# Patient Record
Sex: Male | Born: 1995 | Race: White | Hispanic: No | Marital: Single | Smoking: Current every day smoker
Health system: Southern US, Community
[De-identification: ages and names within clinical notes are randomized; demographics above are authoritative.]

## PROBLEM LIST (undated history)

## (undated) DIAGNOSIS — F909 Attention-deficit hyperactivity disorder, unspecified type: Secondary | ICD-10-CM

## (undated) DIAGNOSIS — IMO0001 Reserved for inherently not codable concepts without codable children: Secondary | ICD-10-CM

## (undated) DIAGNOSIS — S129XXA Fracture of neck, unspecified, initial encounter: Secondary | ICD-10-CM

## (undated) DIAGNOSIS — F319 Bipolar disorder, unspecified: Secondary | ICD-10-CM

## (undated) DIAGNOSIS — S329XXA Fracture of unspecified parts of lumbosacral spine and pelvis, initial encounter for closed fracture: Secondary | ICD-10-CM

## (undated) DIAGNOSIS — K649 Unspecified hemorrhoids: Secondary | ICD-10-CM

## (undated) DIAGNOSIS — S12290D Other displaced fracture of third cervical vertebra, subsequent encounter for fracture with routine healing: Secondary | ICD-10-CM

## (undated) HISTORY — PX: GALLBLADDER SURGERY: SHX652

---
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
## 2014-04-25 ENCOUNTER — Encounter (HOSPITAL_BASED_OUTPATIENT_CLINIC_OR_DEPARTMENT_OTHER): Payer: Self-pay | Admitting: Emergency Medicine

## 2014-04-25 DIAGNOSIS — K8020 Calculus of gallbladder without cholecystitis without obstruction: Secondary | ICD-10-CM | POA: Insufficient documentation

## 2014-04-25 DIAGNOSIS — Z8659 Personal history of other mental and behavioral disorders: Secondary | ICD-10-CM | POA: Insufficient documentation

## 2014-04-25 DIAGNOSIS — Z720 Tobacco use: Secondary | ICD-10-CM | POA: Diagnosis not present

## 2014-04-25 DIAGNOSIS — R1011 Right upper quadrant pain: Secondary | ICD-10-CM | POA: Diagnosis present

## 2014-04-25 NOTE — ED Notes (Signed)
18 yo with sudden onset of RUQ abdominal pain. Pt reports n/v but no diarrhea at this time. Pain with palpation. 10/10 pain. Ambulatory at triage.

## 2014-04-26 ENCOUNTER — Emergency Department (HOSPITAL_BASED_OUTPATIENT_CLINIC_OR_DEPARTMENT_OTHER): Payer: Medicaid Other

## 2014-04-26 ENCOUNTER — Other Ambulatory Visit (HOSPITAL_BASED_OUTPATIENT_CLINIC_OR_DEPARTMENT_OTHER): Payer: Self-pay | Admitting: Emergency Medicine

## 2014-04-26 ENCOUNTER — Ambulatory Visit (HOSPITAL_BASED_OUTPATIENT_CLINIC_OR_DEPARTMENT_OTHER)
Admission: RE | Admit: 2014-04-26 | Discharge: 2014-04-26 | Disposition: A | Discharge status: Home / Self Care | Payer: Medicaid Other | Source: Ambulatory Visit | Attending: Emergency Medicine | Admitting: Emergency Medicine

## 2014-04-26 ENCOUNTER — Emergency Department (HOSPITAL_BASED_OUTPATIENT_CLINIC_OR_DEPARTMENT_OTHER)
Admission: EM | Admit: 2014-04-26 | Discharge: 2014-04-26 | Disposition: A | Discharge status: Home / Self Care | Payer: Medicaid Other | Attending: Emergency Medicine | Admitting: Emergency Medicine

## 2014-04-26 DIAGNOSIS — K8020 Calculus of gallbladder without cholecystitis without obstruction: Secondary | ICD-10-CM | POA: Insufficient documentation

## 2014-04-26 DIAGNOSIS — K8050 Calculus of bile duct without cholangitis or cholecystitis without obstruction: Secondary | ICD-10-CM

## 2014-04-26 DIAGNOSIS — R109 Unspecified abdominal pain: Secondary | ICD-10-CM

## 2014-04-26 LAB — CBC WITH DIFFERENTIAL/PLATELET
BASOS ABS: 0 10*3/uL (ref 0.0–0.1)
BASOS PCT: 0 % (ref 0–1)
Eosinophils Absolute: 0.3 10*3/uL (ref 0.0–0.7)
Eosinophils Relative: 3 % (ref 0–5)
HCT: 43.1 % (ref 39.0–52.0)
Hemoglobin: 14.5 g/dL (ref 13.0–17.0)
Lymphocytes Relative: 37 % (ref 12–46)
Lymphs Abs: 3.8 10*3/uL (ref 0.7–4.0)
MCH: 28.5 pg (ref 26.0–34.0)
MCHC: 33.6 g/dL (ref 30.0–36.0)
MCV: 84.7 fL (ref 78.0–100.0)
Monocytes Absolute: 0.9 10*3/uL (ref 0.1–1.0)
Monocytes Relative: 9 % (ref 3–12)
NEUTROS ABS: 5.4 10*3/uL (ref 1.7–7.7)
NEUTROS PCT: 51 % (ref 43–77)
PLATELETS: 224 10*3/uL (ref 150–400)
RBC: 5.09 MIL/uL (ref 4.22–5.81)
RDW: 12.6 % (ref 11.5–15.5)
WBC: 10.4 10*3/uL (ref 4.0–10.5)

## 2014-04-26 LAB — COMPREHENSIVE METABOLIC PANEL
ALBUMIN: 4.6 g/dL (ref 3.5–5.2)
ALK PHOS: 133 U/L — AB (ref 39–117)
ALT: 54 U/L — AB (ref 0–53)
AST: 42 U/L — AB (ref 0–37)
Anion gap: 17 — ABNORMAL HIGH (ref 5–15)
BILIRUBIN TOTAL: 0.5 mg/dL (ref 0.3–1.2)
BUN: 13 mg/dL (ref 6–23)
CHLORIDE: 102 meq/L (ref 96–112)
CO2: 24 mEq/L (ref 19–32)
CREATININE: 0.8 mg/dL (ref 0.50–1.35)
Calcium: 9.8 mg/dL (ref 8.4–10.5)
GFR calc Af Amer: >90 mL/min (ref >90)
GFR calc non Af Amer: >90 mL/min (ref >90)
Glucose, Bld: 97 mg/dL (ref 70–99)
POTASSIUM: 3.6 meq/L — AB (ref 3.7–5.3)
SODIUM: 143 meq/L (ref 137–147)
Total Protein: 8.6 g/dL — ABNORMAL HIGH (ref 6.0–8.3)

## 2014-04-26 LAB — URINALYSIS, ROUTINE W REFLEX MICROSCOPIC
Bilirubin Urine: NEGATIVE
Glucose, UA: NEGATIVE mg/dL
Hgb urine dipstick: NEGATIVE
KETONES UR: NEGATIVE mg/dL
LEUKOCYTES UA: NEGATIVE
NITRITE: NEGATIVE
PROTEIN: NEGATIVE mg/dL
Specific Gravity, Urine: 1.003 — ABNORMAL LOW (ref 1.005–1.030)
Urobilinogen, UA: 0.2 mg/dL (ref 0.0–1.0)
pH: 6.5 (ref 5.0–8.0)

## 2014-04-26 MED ORDER — FENTANYL CITRATE 0.05 MG/ML IJ SOLN
100.0000 ug | Freq: Once | INTRAMUSCULAR | Status: AC
  Administered 2014-04-26: 100 ug via INTRAVENOUS
  Filled 2014-04-26: qty 2

## 2014-04-26 NOTE — Discharge Instructions (Signed)
Biliary Colic  °Biliary colic is a steady or irregular pain in the upper abdomen. It is usually under the right side of the rib cage. It happens when gallstones interfere with the normal flow of bile from the gallbladder. Bile is a liquid that helps to digest fats. Bile is made in the liver and stored in the gallbladder. When you eat a meal, bile passes from the gallbladder through the cystic duct and the common bile duct into the small intestine. There, it mixes with partially digested food. If a gallstone blocks either of these ducts, the normal flow of bile is blocked. The muscle cells in the bile duct contract forcefully to try to move the stone. This causes the pain of biliary colic.  °SYMPTOMS  °· A person with biliary colic usually complains of pain in the upper abdomen. This pain can be: °¨ In the center of the upper abdomen just below the breastbone. °¨ In the upper-right part of the abdomen, near the gallbladder and liver. °¨ Spread back toward the right shoulder blade. °· Nausea and vomiting. °· The pain usually occurs after eating. °· Biliary colic is usually triggered by the digestive system's demand for bile. The demand for bile is high after fatty meals. Symptoms can also occur when a person who has been fasting suddenly eats a very large meal. Most episodes of biliary colic pass after 1 to 5 hours. After the most intense pain passes, your abdomen may continue to ache mildly for about 24 hours. °DIAGNOSIS  °After you describe your symptoms, your caregiver will perform a physical exam. He or she will pay attention to the upper right portion of your belly (abdomen). This is the area of your liver and gallbladder. An ultrasound will help your caregiver look for gallstones. Specialized scans of the gallbladder may also be done. Blood tests may be done, especially if you have fever or if your pain persists. °PREVENTION  °Biliary colic can be prevented by controlling the risk factors for gallstones. Some of  these risk factors, such as heredity, increasing age, and pregnancy are a normal part of life. Obesity and a high-fat diet are risk factors you can change through a healthy lifestyle. Women going through menopause who take hormone replacement therapy (estrogen) are also more likely to develop biliary colic. °TREATMENT  °· Pain medication may be prescribed. °· You may be encouraged to eat a fat-free diet. °· If the first episode of biliary colic is severe, or episodes of colic keep retuning, surgery to remove the gallbladder (cholecystectomy) is usually recommended. This procedure can be done through small incisions using an instrument called a laparoscope. The procedure often requires a brief stay in the hospital. Some people can leave the hospital the same day. It is the most widely used treatment in people troubled by painful gallstones. It is effective and safe, with no complications in more than 90% of cases. °· If surgery cannot be done, medication that dissolves gallstones may be used. This medication is expensive and can take months or years to work. Only small stones will dissolve. °· Rarely, medication to dissolve gallstones is combined with a procedure called shock-wave lithotripsy. This procedure uses carefully aimed shock waves to break up gallstones. In many people treated with this procedure, gallstones form again within a few years. °PROGNOSIS  °If gallstones block your cystic duct or common bile duct, you are at risk for repeated episodes of biliary colic. There is also a 25% chance that you will develop   a gallbladder infection(acute cholecystitis), or some other complication of gallstones within 10 to 20 years. If you have surgery, schedule it at a time that is convenient for you and at a time when you are not sick. °HOME CARE INSTRUCTIONS  °· Drink plenty of clear fluids. °· Avoid fatty, greasy or fried foods, or any foods that make your pain worse. °· Take medications as directed. °SEEK MEDICAL  CARE IF:  °· You develop a fever over 100.5° F (38.1° C). °· Your pain gets worse over time. °· You develop nausea that prevents you from eating and drinking. °· You develop vomiting. °SEEK IMMEDIATE MEDICAL CARE IF:  °· You have continuous or severe belly (abdominal) pain which is not relieved with medications. °· You develop nausea and vomiting which is not relieved with medications. °· You have symptoms of biliary colic and you suddenly develop a fever and shaking chills. This may signal cholecystitis. Call your caregiver immediately. °· You develop a yellow color to your skin or the white part of your eyes (jaundice). °Document Released: 10/04/2005 Document Revised: 07/26/2011 Document Reviewed: 12/14/2007 °ExitCare® Patient Information ©2015 [employer]. This information is not intended to replace advice given to you by your health care provider. Make sure you discuss any questions you have with your health care provider. ° °

## 2014-04-26 NOTE — ED Provider Notes (Signed)
CSN: [id number]     Arrival date & time 04/25/14  2307 History   First MD Initiated Contact with Patient 04/26/14 0400     Chief Complaint  Patient presents with  . Abdominal Pain     (Consider location/radiation/quality/duration/timing/severity/associated sxs/prior Treatment) HPI  This is a 18 year old male with right upper quadrant abdominal pain that began yesterday evening about 7 PM. He had "felt bad" earlier that day and had to leave school early. His pain became severe and he decided to come to the ED. The pain was associated with nausea and vomiting but no diarrhea. While waiting in the ED his pain is significantly improved although it is not completely absent. The pain is worse with movement or palpation.  Past Medical History  Diagnosis Date  . ADHD (attention deficit hyperactivity disorder)   . Bipolar 1 disorder    History reviewed. No pertinent past surgical history. No family history on file. History  Substance Use Topics  . Smoking status: Current Every Day Smoker -- 0.50 packs/day    Types: Cigarettes  . Smokeless tobacco: Not on file  . Alcohol Use: No    Review of Systems  All other systems reviewed and are negative.   Allergies  Review of patient's allergies indicates no known allergies.  Home Medications   Prior to Admission medications   Not on File   BP 129/74 mmHg  Pulse 74  Temp(Src) 98.2 F (36.8 C) (Oral)  Resp 18  Ht 6\' 1"  (1.854 m)  Wt 167 lb (75.751 kg)  BMI 22.04 kg/m2  SpO2 100%   Physical Exam  General: Well-developed, well-nourished male in no acute distress; appearance consistent with age of record HENT: normocephalic; atraumatic Eyes: pupils equal, round and reactive to light; extraocular muscles intact Neck: supple Heart: regular rate and rhythm; no murmurs, rubs or gallops Lungs: clear to auscultation bilaterally Abdomen: soft; nondistended; right upper quadrant tenderness; no masses or hepatosplenomegaly; bowel sounds  present; multiple large gallstones seen on bedside ultrasound Extremities: No deformity; full range of motion; pulses normal Neurologic: Awake, alert and oriented; motor function intact in all extremities and symmetric; no facial droop Skin: Warm and dry; facial acne Psychiatric: Normal mood and affect   ED Course  Procedures (including critical care time)   MDM   Nursing notes and vitals signs, including pulse oximetry, reviewed.  Summary of this visit's results, reviewed by myself:  Labs:  Results for orders placed or performed during the hospital encounter of 04/26/14 (from the past 24 hour(s))  Urinalysis, Routine w reflex microscopic     Status: Abnormal   Collection Time: 04/25/14 11:40 PM  Result Value Ref Range   Color, Urine YELLOW YELLOW   APPearance CLEAR CLEAR   Specific Gravity, Urine 1.003 (L) 1.005 - 1.030   pH 6.5 5.0 - 8.0   Glucose, UA NEGATIVE NEGATIVE mg/dL   Hgb urine dipstick NEGATIVE NEGATIVE   Bilirubin Urine NEGATIVE NEGATIVE   Ketones, ur NEGATIVE NEGATIVE mg/dL   Protein, ur NEGATIVE NEGATIVE mg/dL   Urobilinogen, UA 0.2 0.0 - 1.0 mg/dL   Nitrite NEGATIVE NEGATIVE   Leukocytes, UA NEGATIVE NEGATIVE  CBC with Differential     Status: None   Collection Time: 04/26/14  1:55 AM  Result Value Ref Range   WBC 10.4 4.0 - 10.5 K/uL   RBC 5.09 4.22 - 5.81 MIL/uL   Hemoglobin 14.5 13.0 - 17.0 g/dL   HCT 40.943.1 81.139.0 - 91.452.0 %   MCV 84.7 78.0 - 100.0  fL   MCH 28.5 26.0 - 34.0 pg   MCHC 33.6 30.0 - 36.0 g/dL   RDW 78.412.6 69.611.5 - 29.515.5 %   Platelets 224 150 - 400 K/uL   Neutrophils Relative % 51 43 - 77 %   Neutro Abs 5.4 1.7 - 7.7 K/uL   Lymphocytes Relative 37 12 - 46 %   Lymphs Abs 3.8 0.7 - 4.0 K/uL   Monocytes Relative 9 3 - 12 %   Monocytes Absolute 0.9 0.1 - 1.0 K/uL   Eosinophils Relative 3 0 - 5 %   Eosinophils Absolute 0.3 0.0 - 0.7 K/uL   Basophils Relative 0 0 - 1 %   Basophils Absolute 0.0 0.0 - 0.1 K/uL  Comprehensive metabolic panel      Status: Abnormal   Collection Time: 04/26/14  1:55 AM  Result Value Ref Range   Sodium 143 137 - 147 mEq/L   Potassium 3.6 (L) 3.7 - 5.3 mEq/L   Chloride 102 96 - 112 mEq/L   CO2 24 19 - 32 mEq/L   Glucose, Bld 97 70 - 99 mg/dL   BUN 13 6 - 23 mg/dL   Creatinine, Ser 2.840.80 0.50 - 1.35 mg/dL   Calcium 9.8 8.4 - 13.210.5 mg/dL   Total Protein 8.6 (H) 6.0 - 8.3 g/dL   Albumin 4.6 3.5 - 5.2 g/dL   AST 42 (H) 0 - 37 U/L   ALT 54 (H) 0 - 53 U/L   Alkaline Phosphatase 133 (H) 39 - 117 U/L   Total Bilirubin 0.5 0.3 - 1.2 mg/dL   GFR calc non Af Amer >90 >90 mL/min   GFR calc Af Amer >90 >90 mL/min   Anion gap 17 (H) 5 - 15    Imaging Studies: Dg Abd Acute W/chest  04/26/2014   CLINICAL DATA:  Sudden onset RIGHT upper quadrant pain last night with nausea and vomiting, constipation for 2 weeks, history smoking, initial encounter  EXAM: ACUTE ABDOMEN SERIES (ABDOMEN 2 VIEW & CHEST 1 VIEW)  COMPARISON:  None  FINDINGS: Normal heart size, mediastinal contours, and pulmonary vascularity.  Lungs hyperinflated but clear.  No pleural effusion or pneumothorax.  Normal bowel gas pattern.  No bowel dilatation or bowel wall thickening, or free intraperitoneal air.  Bones unremarkable for technique.  No urinary tract calcification.  IMPRESSION: No acute abnormalities.   Electronically Signed   By: Ulyses SouthwardMark  Boles M.D.   On: 04/26/2014 02:20   4:14 AM We will have patient return for formal ultrasound later this morning. I do not believe he has acute cholecystitis at this time.      Hanley SeamenJohn L Carlene Bickley, MD 04/26/14 [phone]

## 2014-04-26 NOTE — ED Notes (Signed)
Rt upper abd pain onset 1900 last pm,  States had nausea and vomiting last pm,  Denies diarrhea

## 2014-04-26 NOTE — ED Provider Notes (Signed)
Patient returns for ultrasound results. Cholelithiasis without gallbladder wall thickening or pericholecystic fluid. Positive sonographic Murphy sign during ultrasound. Patient appears well and nontoxic. Tenderness in the right upper quadrant. HIDA scan recommended by radiology.  Mild elevation of LFTs last night.  Recommend follow up with surgery clinic.  If pain worsens or patient develops vomiting or fever recommended [facility]. Advised patient he could check in to be reevaluated but would likely need [facility] or [facility]. Patient declines further evaluation at this facility at this time.   Glynn OctaveStephen Jaidin Richison, MD 04/26/14 [phone]

## 2014-04-28 ENCOUNTER — Emergency Department (HOSPITAL_COMMUNITY)
Admission: EM | Admit: 2014-04-28 | Discharge: 2014-04-29 | Disposition: A | Discharge status: Home / Self Care | Payer: Medicaid Other | Attending: Emergency Medicine | Admitting: Emergency Medicine

## 2014-04-28 ENCOUNTER — Encounter (HOSPITAL_COMMUNITY): Payer: Self-pay

## 2014-04-28 DIAGNOSIS — K8050 Calculus of bile duct without cholangitis or cholecystitis without obstruction: Secondary | ICD-10-CM

## 2014-04-28 DIAGNOSIS — Z8659 Personal history of other mental and behavioral disorders: Secondary | ICD-10-CM | POA: Diagnosis not present

## 2014-04-28 DIAGNOSIS — K8020 Calculus of gallbladder without cholecystitis without obstruction: Secondary | ICD-10-CM | POA: Diagnosis not present

## 2014-04-28 DIAGNOSIS — R1011 Right upper quadrant pain: Secondary | ICD-10-CM

## 2014-04-28 DIAGNOSIS — R0602 Shortness of breath: Secondary | ICD-10-CM | POA: Insufficient documentation

## 2014-04-28 DIAGNOSIS — Z720 Tobacco use: Secondary | ICD-10-CM | POA: Insufficient documentation

## 2014-04-28 NOTE — ED Notes (Signed)
Pt complains of right upper quad pain, n, v, he states that he ate greasy food today and has gallbladder problems.

## 2014-04-29 ENCOUNTER — Emergency Department (HOSPITAL_COMMUNITY): Payer: Medicaid Other

## 2014-04-29 LAB — CBC WITH DIFFERENTIAL/PLATELET
Basophils Absolute: 0 10*3/uL (ref 0.0–0.1)
Basophils Relative: 0 % (ref 0–1)
Eosinophils Absolute: 0.2 10*3/uL (ref 0.0–0.7)
Eosinophils Relative: 2 % (ref 0–5)
HEMATOCRIT: 41.8 % (ref 39.0–52.0)
HEMOGLOBIN: 14.2 g/dL (ref 13.0–17.0)
Lymphocytes Relative: 37 % (ref 12–46)
Lymphs Abs: 3.4 10*3/uL (ref 0.7–4.0)
MCH: 28.7 pg (ref 26.0–34.0)
MCHC: 34 g/dL (ref 30.0–36.0)
MCV: 84.6 fL (ref 78.0–100.0)
MONO ABS: 0.8 10*3/uL (ref 0.1–1.0)
MONOS PCT: 9 % (ref 3–12)
Neutro Abs: 4.8 10*3/uL (ref 1.7–7.7)
Neutrophils Relative %: 52 % (ref 43–77)
Platelets: 240 10*3/uL (ref 150–400)
RBC: 4.94 MIL/uL (ref 4.22–5.81)
RDW: 12.6 % (ref 11.5–15.5)
WBC: 9.2 10*3/uL (ref 4.0–10.5)

## 2014-04-29 LAB — URINALYSIS, ROUTINE W REFLEX MICROSCOPIC
BILIRUBIN URINE: NEGATIVE
Glucose, UA: NEGATIVE mg/dL
HGB URINE DIPSTICK: NEGATIVE
KETONES UR: NEGATIVE mg/dL
Leukocytes, UA: NEGATIVE
NITRITE: NEGATIVE
PH: 7.5 (ref 5.0–8.0)
Protein, ur: NEGATIVE mg/dL
Specific Gravity, Urine: 1.018 (ref 1.005–1.030)
Urobilinogen, UA: 0.2 mg/dL (ref 0.0–1.0)

## 2014-04-29 LAB — COMPREHENSIVE METABOLIC PANEL
ALK PHOS: 131 U/L — AB (ref 39–117)
ALT: 38 U/L (ref 0–53)
ANION GAP: 15 (ref 5–15)
AST: 33 U/L (ref 0–37)
Albumin: 4.5 g/dL (ref 3.5–5.2)
BILIRUBIN TOTAL: 0.4 mg/dL (ref 0.3–1.2)
BUN: 13 mg/dL (ref 6–23)
CHLORIDE: 102 meq/L (ref 96–112)
CO2: 23 meq/L (ref 19–32)
CREATININE: 0.83 mg/dL (ref 0.50–1.35)
Calcium: 9.9 mg/dL (ref 8.4–10.5)
GFR calc Af Amer: >90 mL/min (ref >90)
GFR calc non Af Amer: >90 mL/min (ref >90)
Glucose, Bld: 107 mg/dL — ABNORMAL HIGH (ref 70–99)
Potassium: 3.7 mEq/L (ref 3.7–5.3)
Sodium: 140 mEq/L (ref 137–147)
Total Protein: 8 g/dL (ref 6.0–8.3)

## 2014-04-29 LAB — LIPASE, BLOOD: LIPASE: 30 U/L (ref 11–59)

## 2014-04-29 MED ORDER — OXYCODONE-ACETAMINOPHEN 5-325 MG PO TABS: 1.0000 | ORAL_TABLET | Freq: Four times a day (QID) | ORAL | Status: DC | PRN

## 2014-04-29 MED ORDER — MORPHINE SULFATE 4 MG/ML IJ SOLN
4.0000 mg | Freq: Once | INTRAMUSCULAR | Status: AC
  Administered 2014-04-29: 4 mg via INTRAVENOUS
  Filled 2014-04-29: qty 1

## 2014-04-29 MED ORDER — SODIUM CHLORIDE 0.9 % IV BOLUS (SEPSIS)
1000.0000 mL | Freq: Once | INTRAVENOUS | Status: AC
Start: 2014-04-29 — End: 2014-04-29
  Administered 2014-04-29: 1000 mL via INTRAVENOUS

## 2014-04-29 MED ORDER — ONDANSETRON HCL 4 MG/2ML IJ SOLN
4.0000 mg | INTRAMUSCULAR | Status: AC
  Administered 2014-04-29: 4 mg via INTRAVENOUS
  Filled 2014-04-29: qty 2

## 2014-04-29 NOTE — ED Provider Notes (Signed)
CSN: [id number]     Arrival date & time 04/28/14  2304 History   First MD Initiated Contact with Patient 04/29/14 0106     Chief Complaint  Patient presents with  . Abdominal Pain    (Consider location/radiation/quality/duration/timing/severity/associated sxs/prior Treatment) HPI Comments: Patient is a 18 year old male with no significant past medical history who presents to the emergency department for further evaluation of right upper abdominal pain. He states the pain began at 1900 today and has been intermittent over the past 4 days. Symptoms this evening began after eating baked beans with bacon. He has had associated nausea as well as 3 episodes of nonbloody emesis. Patient was evaluated 4 days ago for similar symptoms at [facility]. He was told at this time that he had gallstones which were likely the cause of his symptoms. He has some associated shortness of breath described mostly as splinting secondary to pain. He has had no associated fever, chest pain, diarrhea, urinary symptoms, melena or hematochezia. No hx of abdominal surgeries.  Patient is a 18 y.o. male presenting with abdominal pain. The history is provided by the patient. No language interpreter was used.  Abdominal Pain Associated symptoms: nausea, shortness of breath and vomiting   Associated symptoms: no diarrhea     Past Medical History  Diagnosis Date  . ADHD (attention deficit hyperactivity disorder)   . Bipolar 1 disorder    History reviewed. No pertinent past surgical history. History reviewed. No pertinent family history. History  Substance Use Topics  . Smoking status: Current Every Day Smoker -- 0.50 packs/day    Types: Cigarettes  . Smokeless tobacco: Not on file  . Alcohol Use: No    Review of Systems  Respiratory: Positive for shortness of breath.   Gastrointestinal: Positive for nausea, vomiting and abdominal pain. Negative for diarrhea and blood in stool.  All other systems reviewed  and are negative.   Allergies  Review of patient's allergies indicates no known allergies.  Home Medications   Prior to Admission medications   Medication Sig Start Date End Date Taking? Authorizing Provider  ibuprofen (ADVIL,MOTRIN) 200 MG tablet Take 200 mg by mouth every 6 (six) hours as needed for moderate pain.   Yes Historical Provider, MD   BP 115/71 mmHg  Pulse 57  Temp(Src) 98.1 F (36.7 C) (Oral)  Resp 18  SpO2 97%   Physical Exam  Constitutional: He is oriented to person, place, and time. He appears well-developed and well-nourished. No distress.  Nontoxic/nonseptic appearing  HENT:  Head: Normocephalic and atraumatic.  Eyes: Conjunctivae and EOM are normal. No scleral icterus.  Neck: Normal range of motion.  Cardiovascular: Normal rate, regular rhythm and normal heart sounds.   Pulmonary/Chest: Effort normal and breath sounds normal. No respiratory distress. He has no wheezes. He has no rales.  Respirations even and unlabored  Abdominal: Soft. Normal appearance. He exhibits no distension. There is tenderness in the right upper quadrant. There is positive Murphy's sign. There is no rebound and no guarding.  Focal tenderness to palpation in the right upper quadrant with a mild positive Murphy sign. Patient has no peritoneal signs or involuntary guarding. No masses appreciated.  Musculoskeletal: Normal range of motion.  Neurological: He is alert and oriented to person, place, and time. He exhibits normal muscle tone. Coordination normal.  GCS 15. Patient moves extremities without ataxia.  Skin: Skin is warm and dry. No rash noted. He is not diaphoretic. No erythema. No pallor.  Psychiatric: He has  a normal mood and affect. His behavior is normal.  Nursing note and vitals reviewed.   ED Course  Procedures (including critical care time) Labs Review Labs Reviewed  COMPREHENSIVE METABOLIC PANEL - Abnormal; Notable for the following:    Glucose, Bld 107 (*)     Alkaline Phosphatase 131 (*)    All other components within normal limits  CBC WITH DIFFERENTIAL  LIPASE, BLOOD  URINALYSIS, ROUTINE W REFLEX MICROSCOPIC    Imaging Review [place] Abdomen Limited Ruq  04/29/2014   CLINICAL DATA:  Right upper quadrant pain  EXAM: US ABDOMEN LIMITED - RIGHT UPPER QUADRANT  COMPARISON:  03/26/2010  FINDINGS: Gallbladder:  The gallbladder is filled with shadowing gallstones. The gallbladder is focally tender but there is no wall thickening or pericholecystic fluid to suggest acute cholecystitis. It is difficult to estimate the size of the gallbladder due to shadowing posteriorly.  Common bile duct:  Diameter: 4 mm  Liver:  No focal lesion identified. Within normal limits in parenchymal echogenicity. Antegrade flow in the imaged portal venous system.  IMPRESSION: 1. Cholelithiasis. 2. There is focal gallbladder tenderness, but no inflammatory wall thickening or edema typical of acute cholecystitis.   Electronically Signed   By: Tiburcio PeaJonathan  Watts M.D.   On: 04/29/2014 03:16     EKG Interpretation None      MDM   Final diagnoses:  RUQ pain  Biliary colic    18 year old male presents to the emergency department for persistent right upper quadrant pain after eating B beans with bacon. Patient was diagnosed with cholelithiasis at [facility] on 04/26/2014. Patient today has no fever or elevated LFTs. No leukocytosis. He has focal tenderness in his right upper quadrant with a mild positive Murphy sign. Ultrasound obtained which shows cholelithiasis without evidence of acute cholecystitis. No inflammatory wall thickening or pericholecystic fluid.  Patient treated in ED with morphine and Zofran. He has had no further emesis since this time and states that his pain has resolved. Symptoms tonight consistent with biliary colic. No indication for admission for emergent cholecystectomy. Patient has been referred back to general surgery to discuss elective cholecystectomy.  Will prescribe short course of Percocet as needed for pain control. Have advised dietary changes to prevent further gallbladder attacks. Return precautions discussed and provided. Patient agreeable to plan with no unaddressed concerns.   Filed Vitals:   04/28/14 2306 04/28/14 2308 04/29/14 0119 04/29/14 0236  BP:  138/84 134/77 115/71  Pulse:  68 69 57  Temp:  98.1 F (36.7 C)    TempSrc:  Oral    Resp:  20 18 18   SpO2: 100% 100% 98% 97%      Antony MaduraKelly Earlie Schank, PA-C 04/29/14 0405  Olivia Mackielga M Otter, MD 04/29/14 [phone]

## 2014-04-29 NOTE — ED Notes (Signed)
US at bedside

## 2014-04-29 NOTE — Discharge Instructions (Signed)
Biliary Colic  °Biliary colic is a steady or irregular pain in the upper abdomen. It is usually under the right side of the rib cage. It happens when gallstones interfere with the normal flow of bile from the gallbladder. Bile is a liquid that helps to digest fats. Bile is made in the liver and stored in the gallbladder. When you eat a meal, bile passes from the gallbladder through the cystic duct and the common bile duct into the small intestine. There, it mixes with partially digested food. If a gallstone blocks either of these ducts, the normal flow of bile is blocked. The muscle cells in the bile duct contract forcefully to try to move the stone. This causes the pain of biliary colic.  °SYMPTOMS  °· A person with biliary colic usually complains of pain in the upper abdomen. This pain can be: °¨ In the center of the upper abdomen just below the breastbone. °¨ In the upper-right part of the abdomen, near the gallbladder and liver. °¨ Spread back toward the right shoulder blade. °· Nausea and vomiting. °· The pain usually occurs after eating. °· Biliary colic is usually triggered by the digestive system's demand for bile. The demand for bile is high after fatty meals. Symptoms can also occur when a person who has been fasting suddenly eats a very large meal. Most episodes of biliary colic pass after 1 to 5 hours. After the most intense pain passes, your abdomen may continue to ache mildly for about 24 hours. °DIAGNOSIS  °After you describe your symptoms, your caregiver will perform a physical exam. He or she will pay attention to the upper right portion of your belly (abdomen). This is the area of your liver and gallbladder. An ultrasound will help your caregiver look for gallstones. Specialized scans of the gallbladder may also be done. Blood tests may be done, especially if you have fever or if your pain persists. °PREVENTION  °Biliary colic can be prevented by controlling the risk factors for gallstones. Some of  these risk factors, such as heredity, increasing age, and pregnancy are a normal part of life. Obesity and a high-fat diet are risk factors you can change through a healthy lifestyle. Women going through menopause who take hormone replacement therapy (estrogen) are also more likely to develop biliary colic. °TREATMENT  °· Pain medication may be prescribed. °· You may be encouraged to eat a fat-free diet. °· If the first episode of biliary colic is severe, or episodes of colic keep retuning, surgery to remove the gallbladder (cholecystectomy) is usually recommended. This procedure can be done through small incisions using an instrument called a laparoscope. The procedure often requires a brief stay in the hospital. Some people can leave the hospital the same day. It is the most widely used treatment in people troubled by painful gallstones. It is effective and safe, with no complications in more than 90% of cases. °· If surgery cannot be done, medication that dissolves gallstones may be used. This medication is expensive and can take months or years to work. Only small stones will dissolve. °· Rarely, medication to dissolve gallstones is combined with a procedure called shock-wave lithotripsy. This procedure uses carefully aimed shock waves to break up gallstones. In many people treated with this procedure, gallstones form again within a few years. °PROGNOSIS  °If gallstones block your cystic duct or common bile duct, you are at risk for repeated episodes of biliary colic. There is also a 25% chance that you will develop   a gallbladder infection(acute cholecystitis), or some other complication of gallstones within 10 to 20 years. If you have surgery, schedule it at a time that is convenient for you and at a time when you are not sick. °HOME CARE INSTRUCTIONS  °· Drink plenty of clear fluids. °· Avoid fatty, greasy or fried foods, or any foods that make your pain worse. °· Take medications as directed. °SEEK MEDICAL  CARE IF:  °· You develop a fever over 100.5° F (38.1° C). °· Your pain gets worse over time. °· You develop nausea that prevents you from eating and drinking. °· You develop vomiting. °SEEK IMMEDIATE MEDICAL CARE IF:  °· You have continuous or severe belly (abdominal) pain which is not relieved with medications. °· You develop nausea and vomiting which is not relieved with medications. °· You have symptoms of biliary colic and you suddenly develop a fever and shaking chills. This may signal cholecystitis. Call your caregiver immediately. °· You develop a yellow color to your skin or the white part of your eyes (jaundice). °Document Released: 10/04/2005 Document Revised: 07/26/2011 Document Reviewed: 12/14/2007 °ExitCare® Patient Information ©2015 [employer]. This information is not intended to replace advice given to you by your health care provider. Make sure you discuss any questions you have with your health care provider. ° °

## 2014-07-08 ENCOUNTER — Emergency Department (HOSPITAL_COMMUNITY): Payer: Medicaid Other

## 2014-07-08 ENCOUNTER — Encounter (HOSPITAL_COMMUNITY): Payer: Self-pay | Admitting: *Deleted

## 2014-07-08 ENCOUNTER — Emergency Department (HOSPITAL_COMMUNITY)
Admission: EM | Admit: 2014-07-08 | Discharge: 2014-07-08 | Disposition: A | Discharge status: Home / Self Care | Payer: Medicaid Other | Attending: Emergency Medicine | Admitting: Emergency Medicine

## 2014-07-08 DIAGNOSIS — K8050 Calculus of bile duct without cholangitis or cholecystitis without obstruction: Secondary | ICD-10-CM

## 2014-07-08 DIAGNOSIS — Z8659 Personal history of other mental and behavioral disorders: Secondary | ICD-10-CM | POA: Diagnosis not present

## 2014-07-08 DIAGNOSIS — Z720 Tobacco use: Secondary | ICD-10-CM | POA: Diagnosis not present

## 2014-07-08 DIAGNOSIS — K8070 Calculus of gallbladder and bile duct without cholecystitis without obstruction: Secondary | ICD-10-CM | POA: Diagnosis not present

## 2014-07-08 DIAGNOSIS — Z79899 Other long term (current) drug therapy: Secondary | ICD-10-CM | POA: Diagnosis not present

## 2014-07-08 DIAGNOSIS — R1011 Right upper quadrant pain: Secondary | ICD-10-CM

## 2014-07-08 DIAGNOSIS — K8020 Calculus of gallbladder without cholecystitis without obstruction: Secondary | ICD-10-CM

## 2014-07-08 LAB — CBC WITH DIFFERENTIAL/PLATELET
BASOS ABS: 0 10*3/uL (ref 0.0–0.1)
Basophils Relative: 0 % (ref 0–1)
EOS PCT: 1 % (ref 0–5)
Eosinophils Absolute: 0.1 10*3/uL (ref 0.0–0.7)
HEMATOCRIT: 42.9 % (ref 39.0–52.0)
Hemoglobin: 15.1 g/dL (ref 13.0–17.0)
LYMPHS ABS: 2.9 10*3/uL (ref 0.7–4.0)
LYMPHS PCT: 36 % (ref 12–46)
MCH: 28.4 pg (ref 26.0–34.0)
MCHC: 35.2 g/dL (ref 30.0–36.0)
MCV: 80.8 fL (ref 78.0–100.0)
Monocytes Absolute: 0.7 10*3/uL (ref 0.1–1.0)
Monocytes Relative: 9 % (ref 3–12)
Neutro Abs: 4.3 10*3/uL (ref 1.7–7.7)
Neutrophils Relative %: 54 % (ref 43–77)
Platelets: 212 10*3/uL (ref 150–400)
RBC: 5.31 MIL/uL (ref 4.22–5.81)
RDW: 13.2 % (ref 11.5–15.5)
WBC: 8.1 10*3/uL (ref 4.0–10.5)

## 2014-07-08 LAB — COMPREHENSIVE METABOLIC PANEL
ALK PHOS: 110 U/L (ref 39–117)
ALT: 32 U/L (ref 0–53)
AST: 33 U/L (ref 0–37)
Albumin: 4.8 g/dL (ref 3.5–5.2)
Anion gap: 10 (ref 5–15)
BILIRUBIN TOTAL: 1.5 mg/dL — AB (ref 0.3–1.2)
BUN: 10 mg/dL (ref 6–23)
CALCIUM: 10 mg/dL (ref 8.4–10.5)
CHLORIDE: 108 mmol/L (ref 96–112)
CO2: 22 mmol/L (ref 19–32)
Creatinine, Ser: 0.79 mg/dL (ref 0.50–1.35)
GFR calc non Af Amer: >90 mL/min (ref >90)
Glucose, Bld: 107 mg/dL — ABNORMAL HIGH (ref 70–99)
POTASSIUM: 3.3 mmol/L — AB (ref 3.5–5.1)
Sodium: 140 mmol/L (ref 135–145)
Total Protein: 7.9 g/dL (ref 6.0–8.3)

## 2014-07-08 LAB — LIPASE, BLOOD: Lipase: 26 U/L (ref 11–59)

## 2014-07-08 MED ORDER — POTASSIUM CHLORIDE CRYS ER 20 MEQ PO TBCR
40.0000 meq | EXTENDED_RELEASE_TABLET | Freq: Once | ORAL | Status: AC
  Administered 2014-07-08: 40 meq via ORAL
  Filled 2014-07-08: qty 2

## 2014-07-08 MED ORDER — OXYCODONE-ACETAMINOPHEN 5-325 MG PO TABS: 1.0000 | ORAL_TABLET | Freq: Four times a day (QID) | ORAL | Status: DC | PRN

## 2014-07-08 MED ORDER — ONDANSETRON 4 MG PO TBDP
8.0000 mg | ORAL_TABLET | Freq: Once | ORAL | Status: AC
  Administered 2014-07-08: 8 mg via ORAL

## 2014-07-08 MED ORDER — ONDANSETRON 8 MG PO TBDP: ORAL_TABLET | ORAL | Status: DC

## 2014-07-08 MED ORDER — MORPHINE SULFATE 4 MG/ML IJ SOLN
4.0000 mg | Freq: Once | INTRAMUSCULAR | Status: AC
  Administered 2014-07-08: 4 mg via INTRAVENOUS
  Filled 2014-07-08: qty 1

## 2014-07-08 MED ORDER — ONDANSETRON HCL 4 MG/2ML IJ SOLN
4.0000 mg | Freq: Once | INTRAMUSCULAR | Status: AC
  Administered 2014-07-08: 4 mg via INTRAVENOUS
  Filled 2014-07-08: qty 2

## 2014-07-08 MED ORDER — ONDANSETRON 4 MG PO TBDP
ORAL_TABLET | ORAL | Status: AC
  Filled 2014-07-08: qty 2

## 2014-07-08 NOTE — ED Notes (Signed)
Patient transported to Ultrasound 

## 2014-07-08 NOTE — ED Notes (Signed)
Pt reports right side abdominal pain since December. Pt states today about a hour ago pain increased and unable to tolerate pain. Pt reports a hx of gall stones and has an appointment for consultation march 2. Pt also c/o n/v

## 2014-07-08 NOTE — Discharge Instructions (Signed)
1. Medications: percocet, zofran, usual home medications 2. Treatment: rest, drink plenty of fluids, eat a blank diet 3. Follow Up: Please followup with your surgeon on 07/17/14 at your already scheduled appointment for discussion of your diagnoses and further evaluation after today's visit; if you do not have a primary care doctor use the resource guide provided to find one; Please return to the ER for high fevers, worsening pain or intractable vomiting    Biliary Colic  Biliary colic is a steady or irregular pain in the upper abdomen. It is usually under the right side of the rib cage. It happens when gallstones interfere with the normal flow of bile from the gallbladder. Bile is a liquid that helps to digest fats. Bile is made in the liver and stored in the gallbladder. When you eat a meal, bile passes from the gallbladder through the cystic duct and the common bile duct into the small intestine. There, it mixes with partially digested food. If a gallstone blocks either of these ducts, the normal flow of bile is blocked. The muscle cells in the bile duct contract forcefully to try to move the stone. This causes the pain of biliary colic.  SYMPTOMS   A person with biliary colic usually complains of pain in the upper abdomen. This pain can be:  In the center of the upper abdomen just below the breastbone.  In the upper-right part of the abdomen, near the gallbladder and liver.  Spread back toward the right shoulder blade.  Nausea and vomiting.  The pain usually occurs after eating.  Biliary colic is usually triggered by the digestive system's demand for bile. The demand for bile is high after fatty meals. Symptoms can also occur when a person who has been fasting suddenly eats a very large meal. Most episodes of biliary colic pass after 1 to 5 hours. After the most intense pain passes, your abdomen may continue to ache mildly for about 24 hours. DIAGNOSIS  After you describe your symptoms,  your caregiver will perform a physical exam. He or she will pay attention to the upper right portion of your belly (abdomen). This is the area of your liver and gallbladder. An ultrasound will help your caregiver look for gallstones. Specialized scans of the gallbladder may also be done. Blood tests may be done, especially if you have fever or if your pain persists. PREVENTION  Biliary colic can be prevented by controlling the risk factors for gallstones. Some of these risk factors, such as heredity, increasing age, and pregnancy are a normal part of life. Obesity and a high-fat diet are risk factors you can change through a healthy lifestyle. Women going through menopause who take hormone replacement therapy (estrogen) are also more likely to develop biliary colic. TREATMENT   Pain medication may be prescribed.  You may be encouraged to eat a fat-free diet.  If the first episode of biliary colic is severe, or episodes of colic keep retuning, surgery to remove the gallbladder (cholecystectomy) is usually recommended. This procedure can be done through small incisions using an instrument called a laparoscope. The procedure often requires a brief stay in the hospital. Some people can leave the hospital the same day. It is the most widely used treatment in people troubled by painful gallstones. It is effective and safe, with no complications in more than 90% of cases.  If surgery cannot be done, medication that dissolves gallstones may be used. This medication is expensive and can take months or years to  work. Only small stones will dissolve.  Rarely, medication to dissolve gallstones is combined with a procedure called shock-wave lithotripsy. This procedure uses carefully aimed shock waves to break up gallstones. In many people treated with this procedure, gallstones form again within a few years. PROGNOSIS  If gallstones block your cystic duct or common bile duct, you are at risk for repeated episodes  of biliary colic. There is also a 25% chance that you will develop a gallbladder infection(acute cholecystitis), or some other complication of gallstones within 10 to 20 years. If you have surgery, schedule it at a time that is convenient for you and at a time when you are not sick. HOME CARE INSTRUCTIONS   Drink plenty of clear fluids.  Avoid fatty, greasy or fried foods, or any foods that make your pain worse.  Take medications as directed. SEEK MEDICAL CARE IF:   You develop a fever over 100.5 F (38.1 C).  Your pain gets worse over time.  You develop nausea that prevents you from eating and drinking.  You develop vomiting. SEEK IMMEDIATE MEDICAL CARE IF:   You have continuous or severe belly (abdominal) pain which is not relieved with medications.  You develop nausea and vomiting which is not relieved with medications.  You have symptoms of biliary colic and you suddenly develop a fever and shaking chills. This may signal cholecystitis. Call your caregiver immediately.  You develop a yellow color to your skin or the white part of your eyes (jaundice). Document Released: 10/04/2005 Document Revised: 07/26/2011 Document Reviewed: 12/14/2007 [facility] Patient Information 2015 [place], [place]. This information is not intended to replace advice given to you by your health care provider. Make sure you discuss any questions you have with your health care provider.

## 2014-07-08 NOTE — ED Notes (Signed)
Per EMS: Pt reports RUQ and RLQ abdominal pain. Pt states he gall stones, has a consultation to schedule surgery march 2nd. Reports some nausea, denies vomiting.

## 2014-07-08 NOTE — ED Provider Notes (Signed)
CSN: [id number]     Arrival date & time 07/08/14  1607 History   First MD Initiated Contact with Patient 07/08/14 2030     Chief Complaint  Patient presents with  . Abdominal Pain     (Consider location/radiation/quality/duration/timing/severity/associated sxs/prior Treatment) Patient is a 19 y.o. male presenting with abdominal pain. The history is provided by the patient and medical records. No language interpreter was used.  Abdominal Pain Associated symptoms: nausea   Associated symptoms: no chest pain, no constipation, no cough, no diarrhea, no dysuria, no fatigue, no fever, no hematuria, no shortness of breath and no vomiting      Johnny RhymesMitchell S Campos is a 19 y.o. male  with a hx of ADHD, bipolar disorder, gallstones diagnosed in December 2015 presents to the Emergency Department complaining of gradual, persistent, progressively worsening right upper quadrant abdominal pain onset 10 AM this morning. Patient reports increasing pain for the last several weeks. He has an appointment for cholecystectomy on 07/17/2014. He reports that he had not eaten anything prior to the pain but has been tolerating food since that time. Patient is eating here in the emergency department, complaining of 10/10 pain in the right upper quadrant and nausea.  Patient denies fevers or chills, vomiting or diarrhea. He reports taking Tylenol for the pain without improvement. Nothing seems to make the symptoms better or worse. He denies fever, chills, headache, neck pain, chest pain, shortness of breath, vomiting, weakness, dizziness, syncope, dysuria.  Past Medical History  Diagnosis Date  . ADHD (attention deficit hyperactivity disorder)   . Bipolar 1 disorder    History reviewed. No pertinent past surgical history. History reviewed. No pertinent family history. History  Substance Use Topics  . Smoking status: Current Every Day Smoker -- 0.50 packs/day    Types: Cigarettes  . Smokeless tobacco: Not on file  .  Alcohol Use: No    Review of Systems  Constitutional: Negative for fever, diaphoresis, appetite change, fatigue and unexpected weight change.  HENT: Negative for mouth sores and trouble swallowing.   Respiratory: Negative for cough, chest tightness, shortness of breath, wheezing and stridor.   Cardiovascular: Negative for chest pain and palpitations.  Gastrointestinal: Positive for nausea and abdominal pain. Negative for vomiting, diarrhea, constipation, blood in stool, abdominal distention and rectal pain.  Genitourinary: Negative for dysuria, urgency, frequency, hematuria, flank pain and difficulty urinating.  Musculoskeletal: Negative for back pain, neck pain and neck stiffness.  Skin: Negative for rash.  Neurological: Negative for weakness.  Hematological: Negative for adenopathy.  Psychiatric/Behavioral: Negative for confusion.  All other systems reviewed and are negative.     Allergies  Review of patient's allergies indicates no known allergies.  Home Medications   Prior to Admission medications   Medication Sig Start Date End Date Taking? Authorizing Provider  acetaminophen (TYLENOL) 325 MG tablet Take 650 mg by mouth every 6 (six) hours as needed.   Yes Historical Provider, MD  ibuprofen (ADVIL,MOTRIN) 200 MG tablet Take 200 mg by mouth every 6 (six) hours as needed for moderate pain.   Yes Historical Provider, MD  ondansetron (ZOFRAN ODT) 8 MG disintegrating tablet 8mg  ODT q4 hours prn nausea 07/08/14   Dahlia ClientHannah Oneka Parada, PA-C  oxyCODONE-acetaminophen (PERCOCET) 5-325 MG per tablet Take 1-2 tablets by mouth every 6 (six) hours as needed for severe pain. 07/08/14   Ariyan Sinnett, PA-C   BP 140/65 mmHg  Pulse 65  Temp(Src) 97.7 F (36.5 C) (Oral)  Resp 16  Ht 6\' 2"  (1.88 m)  Wt 163 lb (73.936 kg)  BMI 20.92 kg/m2  SpO2 98% Physical Exam  Constitutional: He appears well-developed and well-nourished. No distress.  Awake, alert, nontoxic appearance  HENT:  Head:  Normocephalic and atraumatic.  Mouth/Throat: Oropharynx is clear and moist. No oropharyngeal exudate.  Eyes: Conjunctivae are normal. No scleral icterus.  Neck: Normal range of motion. Neck supple.  Cardiovascular: Normal rate, regular rhythm, normal heart sounds and intact distal pulses.   No murmur heard. Pulmonary/Chest: Effort normal and breath sounds normal. No respiratory distress. He has no wheezes.  Equal chest expansion  Abdominal: Soft. Bowel sounds are normal. He exhibits no distension and no mass. There is tenderness in the right upper quadrant. There is guarding. There is no rebound and no CVA tenderness.  Right upper quadrant abdominal tenderness with guarding No peritoneal signs No CVA tenderness  Musculoskeletal: Normal range of motion. He exhibits no edema.  Neurological: He is alert.  Speech is clear and goal oriented Moves extremities without ataxia  Skin: Skin is warm and dry. He is not diaphoretic.  Psychiatric: He has a normal mood and affect.  Nursing note and vitals reviewed.   ED Course  Procedures (including critical care time) Labs Review Labs Reviewed  COMPREHENSIVE METABOLIC PANEL - Abnormal; Notable for the following:    Potassium 3.3 (*)    Glucose, Bld 107 (*)    Total Bilirubin 1.5 (*)    All other components within normal limits  CBC WITH DIFFERENTIAL/PLATELET  LIPASE, BLOOD    Imaging Review US Abdomen Limited  07/08/2014   CLINICAL DATA:  Right upper quadrant abdominal pain, known gallstones.  EXAM: US ABDOMEN LIMITED - RIGHT UPPER QUADRANT  COMPARISON:  Right upper quadrant ultrasound 04/29/2014, 04/26/2014  FINDINGS: Gallbladder:  Contains multiple shadowing stones partially obscuring evaluation of the posterior wall. Borderline wall thickening of 3.5 mm. No sonographic Murphy sign noted. No pericholecystic fluid.  Common bile duct:  Diameter: Mildly prominent measuring 5 mm.  Liver:  No focal lesion identified. Within normal limits in  parenchymal echogenicity.  IMPRESSION: 1. Cholelithiasis. There is borderline gallbladder wall thickening of 3.5 mm, however negative sonographic Murphy's sign at this time. This can be seen in the setting of chronic gallbladder inflammation versus less likely acute cholecystitis. 2. Mild prominence of the common bile duct measures 5 mm, previously 2-4 mm. 3. Nuclear medicine HIDA scan could be considered if there is concern for acute cholecystitis.   Electronically Signed   By: Rubye Oaks M.D.   On: 07/08/2014 22:38     EKG Interpretation None      MDM   Final diagnoses:  RUQ abdominal pain  Gallstones  Biliary colic   Johnny Campos presents with right upper quadrant pain with a history of gallstones. Labs reassuring and unlikely to have choledocholithiasis. Will obtain limited ultrasound to verify.  Pt will be given pain control.    10:45PM Ultrasound with cholelithiasis and borderline gallbladder wall thickening more consistent with chronic gallbladder inflammation and less likely acute cholecystitis. A repeat exam patient with soft nontender abdomen. Labs are reassuring without alterations in hepatic enzymes, lipase or leukocytosis. Will by mouth trial and reassess.  Patient's symptoms are not consistent with acute cholecystitis.  11:30PM Patient eating and drinking here in the emergency department without emesis. His abdomen remained stable and nontender. Patient has an appointment with general surgery next week for evaluation and scheduling of his cholecystectomy. Patient will be discharged home with pain medicine, nausea medicine and a strict  diet. I have discussed at length things that he should not be eating including fried foods which she has continued to do even after his diagnosis of gallstones likely contributing to his pain.  I have personally reviewed patient's vitals, nursing note and any pertinent labs or imaging.  I performed an undressed physical exam.    It has  been determined that no acute conditions requiring further emergency intervention are present at this time. The patient/guardian have been advised of the diagnosis and plan. I reviewed all labs and imaging including any potential incidental findings. We have discussed signs and symptoms that warrant return to the ED and they are listed in the discharge instructions.    Vital signs are stable at discharge.   BP 140/65 mmHg  Pulse 65  Temp(Src) 97.7 F (36.5 C) (Oral)  Resp 16  Ht  (1.88 m)  Wt 163 lb (73.936 kg)  BMI 20.92 kg/m2  SpO2 98%        Dierdre Forth, PA-C 07/09/14 0004  Dione Booze, MD 07/09/14 0030

## 2014-07-08 NOTE — ED Notes (Signed)
Pt currently eating food at this time, informed pt of possible increase in pain if he is having "gallbladder pains" as described. Pt continues to eat and drink at this time.

## 2014-07-29 ENCOUNTER — Other Ambulatory Visit (INDEPENDENT_AMBULATORY_CARE_PROVIDER_SITE_OTHER): Payer: Self-pay | Admitting: General Surgery

## 2014-08-06 NOTE — Pre-Procedure Instructions (Addendum)
Johnny Campos  08/06/2014   Your procedure is scheduled on:  Tuesday, March 29.  Report to [facility] Admitting at 5:30 AM.   Call this number if you have problems the morning of surgery: [phone]                For any other questions, please call [phone], Monday - Friday 8 AM - 4 PM.   Remember:   Do not eat food or drink liquids after midnight Monday, March 28.    Take these medicines the morning of surgery with A SIP OF WATER: Take if needed: acetaminophen (TYLENOL) or             oxyCODONE-acetaminophen (PERCOCET) , ondansetron (ZOFRAN).              Stop taking Aspirin, Coumadin, Plavix, Effient and Herbal medications.  Do not take any NSAIDs ie: Ibuprofen,  Advil,Naproxen or any medication containing Aspirin.   Do not wear jewelry, make-up or nail polish.  Do not wear lotions, powders, or perfumes.              Men may shave face and neck.  Do not bring valuables to the hospital.               [facility] is not responsible   or any belongings or valuables.               Contacts, dentures or bridgework may not be worn into surgery.  Leave suitcase in the car. After surgery it may be brought to your room.  For patients admitted to the hospital, discharge time is determined by your  treatment team.               Patients discharged the day of surgery will not be allowed to drive home.  Name and phone number of your driver: -   Special Instructions: - Special Instructions:  - Preparing for Surgery  Before surgery, you can play an important role.  Because skin is not sterile, your skin needs to be as free of germs as possible.  You can reduce the number of germs on you skin by washing with CHG (chlorahexidine gluconate) soap before surgery.  CHG is an antiseptic cleaner which kills germs and bonds with the skin to continue killing germs even after washing.  Please DO NOT use if you have an allergy to CHG or antibacterial soaps.  If your  skin becomes reddened/irritated stop using the CHG and inform your nurse when you arrive at Short Stay.  Do not shave (including legs and underarms) for at least 48 hours prior to the first CHG shower.  You may shave your face.  Please follow these instructions carefully:   1.  Shower with CHG Soap the night before surgery and the morning of Surgery.  2.  If you choose to wash your hair, wash your hair first as usual with your normal shampoo.  3.  After you shampoo, rinse your hair and body thoroughly to remove the Shampoo.  4.  Use CHG as you would any other liquid soap.  You can apply chg directly  to the skin and wash gently with scrungie or a clean washcloth.  5.  Apply the CHG Soap to your body ONLY FROM THE NECK DOWN.  Do not use on open wounds or open sores.  Avoid contact with your eyes ears, mouth and genitals (private parts).  Wash genitals (private parts)  with your normal soap.  6.  Wash thoroughly, paying special attention to the area where your surgery will be performed.  7.  Thoroughly rinse your body with warm water from the neck down.  8.  DO NOT shower/wash with your normal soap after using and rinsing off the CHG Soap.  9.  Pat yourself dry with a clean towel.            10.  Wear clean pajamas.            11.  Place clean sheets on your bed the night of your first shower and do not sleep with pets.  Day of Surgery  Do not apply any lotions/deodorants the morning of surgery.  Please wear clean clothes to the hospital/surgery center.   Please read over the following fact sheets that you were given: Pain Booklet, Coughing and Deep Breathing and Surgical Site Infection Prevention

## 2014-08-07 ENCOUNTER — Encounter (HOSPITAL_COMMUNITY): Payer: Self-pay

## 2014-08-07 ENCOUNTER — Encounter (HOSPITAL_COMMUNITY)
Admission: RE | Admit: 2014-08-07 | Discharge: 2014-08-07 | Disposition: A | Discharge status: Home / Self Care | Payer: Medicaid Other | Source: Ambulatory Visit | Attending: General Surgery | Admitting: General Surgery

## 2014-08-07 ENCOUNTER — Other Ambulatory Visit (HOSPITAL_COMMUNITY): Payer: Medicaid Other

## 2014-08-07 DIAGNOSIS — R109 Unspecified abdominal pain: Secondary | ICD-10-CM | POA: Diagnosis present

## 2014-08-07 DIAGNOSIS — Z01812 Encounter for preprocedural laboratory examination: Secondary | ICD-10-CM | POA: Insufficient documentation

## 2014-08-07 DIAGNOSIS — F909 Attention-deficit hyperactivity disorder, unspecified type: Secondary | ICD-10-CM | POA: Diagnosis not present

## 2014-08-07 DIAGNOSIS — F319 Bipolar disorder, unspecified: Secondary | ICD-10-CM | POA: Diagnosis not present

## 2014-08-07 DIAGNOSIS — R339 Retention of urine, unspecified: Secondary | ICD-10-CM | POA: Diagnosis not present

## 2014-08-07 DIAGNOSIS — F17210 Nicotine dependence, cigarettes, uncomplicated: Secondary | ICD-10-CM | POA: Diagnosis not present

## 2014-08-07 DIAGNOSIS — K8012 Calculus of gallbladder with acute and chronic cholecystitis without obstruction: Secondary | ICD-10-CM | POA: Diagnosis not present

## 2014-08-07 HISTORY — DX: Reserved for inherently not codable concepts without codable children: IMO0001

## 2014-08-07 LAB — COMPREHENSIVE METABOLIC PANEL
ALBUMIN: 4.4 g/dL (ref 3.5–5.2)
ALK PHOS: 94 U/L (ref 39–117)
ALT: 32 U/L (ref 0–53)
AST: 28 U/L (ref 0–37)
Anion gap: 7 (ref 5–15)
BUN: 9 mg/dL (ref 6–23)
CHLORIDE: 107 mmol/L (ref 96–112)
CO2: 26 mmol/L (ref 19–32)
CREATININE: 0.71 mg/dL (ref 0.50–1.35)
Calcium: 9.7 mg/dL (ref 8.4–10.5)
GFR calc Af Amer: >90 mL/min (ref >90)
GFR calc non Af Amer: >90 mL/min (ref >90)
Glucose, Bld: 80 mg/dL (ref 70–99)
Potassium: 4 mmol/L (ref 3.5–5.1)
Sodium: 140 mmol/L (ref 135–145)
TOTAL PROTEIN: 7.3 g/dL (ref 6.0–8.3)
Total Bilirubin: 1.6 mg/dL — ABNORMAL HIGH (ref 0.3–1.2)

## 2014-08-07 LAB — CBC
HCT: 41.1 % (ref 39.0–52.0)
Hemoglobin: 13.7 g/dL (ref 13.0–17.0)
MCH: 28.2 pg (ref 26.0–34.0)
MCHC: 33.3 g/dL (ref 30.0–36.0)
MCV: 84.6 fL (ref 78.0–100.0)
PLATELETS: 189 10*3/uL (ref 150–400)
RBC: 4.86 MIL/uL (ref 4.22–5.81)
RDW: 14.1 % (ref 11.5–15.5)
WBC: 5.9 10*3/uL (ref 4.0–10.5)

## 2014-08-10 ENCOUNTER — Encounter (HOSPITAL_COMMUNITY)
Admission: EM | Disposition: A | Discharge status: Home / Self Care | Payer: Self-pay | Source: Home / Self Care | Attending: Emergency Medicine

## 2014-08-10 ENCOUNTER — Observation Stay (HOSPITAL_COMMUNITY): Payer: Medicaid Other | Admitting: Registered Nurse

## 2014-08-10 ENCOUNTER — Emergency Department (HOSPITAL_COMMUNITY): Payer: Medicaid Other

## 2014-08-10 ENCOUNTER — Encounter (HOSPITAL_COMMUNITY): Payer: Self-pay | Admitting: Emergency Medicine

## 2014-08-10 ENCOUNTER — Observation Stay (HOSPITAL_COMMUNITY)
Admission: EM | Admit: 2014-08-10 | Discharge: 2014-08-12 | Disposition: A | Discharge status: Home / Self Care | Payer: Medicaid Other | Attending: Surgery | Admitting: Surgery

## 2014-08-10 ENCOUNTER — Observation Stay (HOSPITAL_COMMUNITY): Payer: Medicaid Other

## 2014-08-10 DIAGNOSIS — K812 Acute cholecystitis with chronic cholecystitis: Secondary | ICD-10-CM

## 2014-08-10 DIAGNOSIS — F909 Attention-deficit hyperactivity disorder, unspecified type: Secondary | ICD-10-CM | POA: Diagnosis not present

## 2014-08-10 DIAGNOSIS — F17210 Nicotine dependence, cigarettes, uncomplicated: Secondary | ICD-10-CM | POA: Insufficient documentation

## 2014-08-10 DIAGNOSIS — K829 Disease of gallbladder, unspecified: Secondary | ICD-10-CM | POA: Diagnosis present

## 2014-08-10 DIAGNOSIS — R202 Paresthesia of skin: Secondary | ICD-10-CM

## 2014-08-10 DIAGNOSIS — K8012 Calculus of gallbladder with acute and chronic cholecystitis without obstruction: Principal | ICD-10-CM | POA: Insufficient documentation

## 2014-08-10 DIAGNOSIS — R1011 Right upper quadrant pain: Secondary | ICD-10-CM

## 2014-08-10 DIAGNOSIS — R200 Anesthesia of skin: Secondary | ICD-10-CM

## 2014-08-10 DIAGNOSIS — F319 Bipolar disorder, unspecified: Secondary | ICD-10-CM | POA: Insufficient documentation

## 2014-08-10 DIAGNOSIS — K819 Cholecystitis, unspecified: Secondary | ICD-10-CM

## 2014-08-10 DIAGNOSIS — R339 Retention of urine, unspecified: Secondary | ICD-10-CM | POA: Insufficient documentation

## 2014-08-10 DIAGNOSIS — K8020 Calculus of gallbladder without cholecystitis without obstruction: Secondary | ICD-10-CM | POA: Diagnosis present

## 2014-08-10 HISTORY — PX: CHOLECYSTECTOMY: SHX55

## 2014-08-10 LAB — CBC WITH DIFFERENTIAL/PLATELET
BASOS PCT: 0 % (ref 0–1)
Basophils Absolute: 0 10*3/uL (ref 0.0–0.1)
Eosinophils Absolute: 0.2 10*3/uL (ref 0.0–0.7)
Eosinophils Relative: 2 % (ref 0–5)
HEMATOCRIT: 41.1 % (ref 39.0–52.0)
Hemoglobin: 14 g/dL (ref 13.0–17.0)
Lymphocytes Relative: 40 % (ref 12–46)
Lymphs Abs: 3.7 10*3/uL (ref 0.7–4.0)
MCH: 28.5 pg (ref 26.0–34.0)
MCHC: 34.1 g/dL (ref 30.0–36.0)
MCV: 83.5 fL (ref 78.0–100.0)
Monocytes Absolute: 1 10*3/uL (ref 0.1–1.0)
Monocytes Relative: 11 % (ref 3–12)
Neutro Abs: 4.5 10*3/uL (ref 1.7–7.7)
Neutrophils Relative %: 47 % (ref 43–77)
Platelets: 215 10*3/uL (ref 150–400)
RBC: 4.92 MIL/uL (ref 4.22–5.81)
RDW: 13.9 % (ref 11.5–15.5)
WBC: 9.3 10*3/uL (ref 4.0–10.5)

## 2014-08-10 LAB — COMPREHENSIVE METABOLIC PANEL
ALT: 31 U/L (ref 0–53)
AST: 31 U/L (ref 0–37)
Albumin: 4.9 g/dL (ref 3.5–5.2)
Alkaline Phosphatase: 100 U/L (ref 39–117)
Anion gap: 9 (ref 5–15)
BUN: 16 mg/dL (ref 6–23)
CO2: 22 mmol/L (ref 19–32)
CREATININE: 0.73 mg/dL (ref 0.50–1.35)
Calcium: 9.7 mg/dL (ref 8.4–10.5)
Chloride: 107 mmol/L (ref 96–112)
GFR calc Af Amer: >90 mL/min (ref >90)
GLUCOSE: 91 mg/dL (ref 70–99)
POTASSIUM: 3.3 mmol/L — AB (ref 3.5–5.1)
Sodium: 138 mmol/L (ref 135–145)
Total Bilirubin: 1.8 mg/dL — ABNORMAL HIGH (ref 0.3–1.2)
Total Protein: 7.9 g/dL (ref 6.0–8.3)

## 2014-08-10 LAB — URINALYSIS, ROUTINE W REFLEX MICROSCOPIC
BILIRUBIN URINE: NEGATIVE
GLUCOSE, UA: NEGATIVE mg/dL
HGB URINE DIPSTICK: NEGATIVE
Ketones, ur: >80 mg/dL — AB
Leukocytes, UA: NEGATIVE
NITRITE: NEGATIVE
PH: 7 (ref 5.0–8.0)
Protein, ur: NEGATIVE mg/dL
SPECIFIC GRAVITY, URINE: 1.03 (ref 1.005–1.030)
Urobilinogen, UA: 1 mg/dL (ref 0.0–1.0)

## 2014-08-10 LAB — LIPASE, BLOOD: Lipase: 20 U/L (ref 11–59)

## 2014-08-10 SURGERY — LAPAROSCOPIC CHOLECYSTECTOMY WITH INTRAOPERATIVE CHOLANGIOGRAM
Anesthesia: General | Site: Abdomen

## 2014-08-10 MED ORDER — BUPIVACAINE-EPINEPHRINE (PF) 0.25% -1:200000 IJ SOLN
INTRAMUSCULAR | Status: AC
  Filled 2014-08-10: qty 30

## 2014-08-10 MED ORDER — ROCURONIUM BROMIDE 100 MG/10ML IV SOLN
INTRAVENOUS | Status: DC | PRN
  Administered 2014-08-10: 30 mg via INTRAVENOUS
  Administered 2014-08-10: 10 mg via INTRAVENOUS

## 2014-08-10 MED ORDER — FENTANYL CITRATE 0.05 MG/ML IJ SOLN
INTRAMUSCULAR | Status: AC
  Filled 2014-08-10: qty 5

## 2014-08-10 MED ORDER — MIDAZOLAM HCL 5 MG/5ML IJ SOLN
INTRAMUSCULAR | Status: DC | PRN
  Administered 2014-08-10: 2 mg via INTRAVENOUS

## 2014-08-10 MED ORDER — NICOTINE 14 MG/24HR TD PT24
14.0000 mg | MEDICATED_PATCH | Freq: Every day | TRANSDERMAL | Status: DC
  Administered 2014-08-10 – 2014-08-12 (×3): 14 mg via TRANSDERMAL
  Filled 2014-08-10 (×3): qty 1

## 2014-08-10 MED ORDER — PROMETHAZINE HCL 25 MG/ML IJ SOLN
6.2500 mg | INTRAMUSCULAR | Status: AC | PRN
  Administered 2014-08-10 (×2): 12.5 mg via INTRAVENOUS
  Filled 2014-08-10 (×3): qty 1

## 2014-08-10 MED ORDER — IOHEXOL 300 MG/ML  SOLN
INTRAMUSCULAR | Status: DC | PRN
  Administered 2014-08-10: 4 mL

## 2014-08-10 MED ORDER — EPHEDRINE SULFATE 50 MG/ML IJ SOLN
INTRAMUSCULAR | Status: DC | PRN
  Administered 2014-08-10: 5 mg via INTRAVENOUS

## 2014-08-10 MED ORDER — ONDANSETRON HCL 4 MG/2ML IJ SOLN
4.0000 mg | Freq: Once | INTRAMUSCULAR | Status: AC
  Administered 2014-08-10: 4 mg via INTRAVENOUS
  Filled 2014-08-10: qty 2

## 2014-08-10 MED ORDER — 0.9 % SODIUM CHLORIDE (POUR BTL) OPTIME
TOPICAL | Status: DC | PRN
  Administered 2014-08-10: 1000 mL

## 2014-08-10 MED ORDER — CEFAZOLIN SODIUM-DEXTROSE 2-3 GM-% IV SOLR
INTRAVENOUS | Status: DC | PRN
  Administered 2014-08-10: 2 g via INTRAVENOUS

## 2014-08-10 MED ORDER — CEFAZOLIN SODIUM-DEXTROSE 2-3 GM-% IV SOLR
INTRAVENOUS | Status: AC
  Filled 2014-08-10: qty 50

## 2014-08-10 MED ORDER — LIDOCAINE HCL (CARDIAC) 20 MG/ML IV SOLN
INTRAVENOUS | Status: AC
  Filled 2014-08-10: qty 5

## 2014-08-10 MED ORDER — LACTATED RINGERS IV SOLN
INTRAVENOUS | Status: DC | PRN
  Administered 2014-08-10: 09:00:00 via INTRAVENOUS

## 2014-08-10 MED ORDER — ATROPINE SULFATE 0.4 MG/ML IJ SOLN
INTRAMUSCULAR | Status: AC
  Filled 2014-08-10: qty 2

## 2014-08-10 MED ORDER — HYDROMORPHONE HCL 1 MG/ML IJ SOLN
1.0000 mg | Freq: Once | INTRAMUSCULAR | Status: AC
  Administered 2014-08-10: 1 mg via INTRAVENOUS
  Filled 2014-08-10: qty 1

## 2014-08-10 MED ORDER — MORPHINE SULFATE 4 MG/ML IJ SOLN
4.0000 mg | Freq: Once | INTRAMUSCULAR | Status: AC
  Administered 2014-08-10: 4 mg via INTRAVENOUS
  Filled 2014-08-10: qty 1

## 2014-08-10 MED ORDER — HYDROMORPHONE HCL 1 MG/ML IJ SOLN
0.2500 mg | INTRAMUSCULAR | Status: DC | PRN
Start: 2014-08-10 — End: 2014-08-10
  Administered 2014-08-10 (×5): 0.5 mg via INTRAVENOUS

## 2014-08-10 MED ORDER — EPHEDRINE SULFATE 50 MG/ML IJ SOLN
INTRAMUSCULAR | Status: AC
Start: 2014-08-10 — End: 2014-08-10
  Filled 2014-08-10: qty 1

## 2014-08-10 MED ORDER — ONDANSETRON HCL 4 MG/2ML IJ SOLN
4.0000 mg | Freq: Four times a day (QID) | INTRAMUSCULAR | Status: DC | PRN
  Administered 2014-08-10: 4 mg via INTRAVENOUS
  Filled 2014-08-10: qty 2

## 2014-08-10 MED ORDER — FENTANYL CITRATE 0.05 MG/ML IJ SOLN
INTRAMUSCULAR | Status: DC | PRN
Start: 2014-08-10 — End: 2014-08-10
  Administered 2014-08-10 (×4): 50 ug via INTRAVENOUS
  Administered 2014-08-10 (×2): 25 ug via INTRAVENOUS

## 2014-08-10 MED ORDER — GLYCOPYRROLATE 0.2 MG/ML IJ SOLN
INTRAMUSCULAR | Status: AC
  Filled 2014-08-10: qty 3

## 2014-08-10 MED ORDER — SODIUM CHLORIDE 0.9 % IJ SOLN
INTRAMUSCULAR | Status: AC
  Filled 2014-08-10: qty 10

## 2014-08-10 MED ORDER — HEPARIN SODIUM (PORCINE) 5000 UNIT/ML IJ SOLN
5000.0000 [IU] | Freq: Three times a day (TID) | INTRAMUSCULAR | Status: DC
  Administered 2014-08-10 – 2014-08-12 (×6): 5000 [IU] via SUBCUTANEOUS
  Filled 2014-08-10 (×9): qty 1

## 2014-08-10 MED ORDER — LACTATED RINGERS IR SOLN
Status: DC | PRN
  Administered 2014-08-10: 1

## 2014-08-10 MED ORDER — NEOSTIGMINE METHYLSULFATE 10 MG/10ML IV SOLN
INTRAVENOUS | Status: AC
  Filled 2014-08-10: qty 1

## 2014-08-10 MED ORDER — HYDROCODONE-ACETAMINOPHEN 5-325 MG PO TABS
1.0000 | ORAL_TABLET | ORAL | Status: DC | PRN
  Administered 2014-08-10 – 2014-08-11 (×3): 2 via ORAL
  Filled 2014-08-10 (×3): qty 2

## 2014-08-10 MED ORDER — GLYCOPYRROLATE 0.2 MG/ML IJ SOLN
INTRAMUSCULAR | Status: DC | PRN
  Administered 2014-08-10: .6 mg via INTRAVENOUS

## 2014-08-10 MED ORDER — HYDROMORPHONE HCL 1 MG/ML IJ SOLN
INTRAMUSCULAR | Status: AC
  Filled 2014-08-10: qty 2

## 2014-08-10 MED ORDER — ONDANSETRON HCL 4 MG PO TABS: 4.0000 mg | ORAL_TABLET | Freq: Four times a day (QID) | ORAL | Status: DC | PRN

## 2014-08-10 MED ORDER — MORPHINE SULFATE 2 MG/ML IJ SOLN
1.0000 mg | INTRAMUSCULAR | Status: DC | PRN
  Administered 2014-08-10 – 2014-08-11 (×5): 2 mg via INTRAVENOUS
  Filled 2014-08-10 (×5): qty 1

## 2014-08-10 MED ORDER — IBUPROFEN 200 MG PO TABS: 600.0000 mg | ORAL_TABLET | Freq: Four times a day (QID) | ORAL | Status: DC | PRN

## 2014-08-10 MED ORDER — LIDOCAINE HCL (CARDIAC) 20 MG/ML IV SOLN
INTRAVENOUS | Status: DC | PRN
  Administered 2014-08-10: 100 mg via INTRAVENOUS

## 2014-08-10 MED ORDER — PROPOFOL 10 MG/ML IV BOLUS
INTRAVENOUS | Status: DC | PRN
  Administered 2014-08-10: 150 mg via INTRAVENOUS

## 2014-08-10 MED ORDER — SODIUM CHLORIDE 0.9 % IV BOLUS (SEPSIS)
1000.0000 mL | Freq: Once | INTRAVENOUS | Status: AC
  Administered 2014-08-10: 1000 mL via INTRAVENOUS

## 2014-08-10 MED ORDER — HYDROMORPHONE HCL 1 MG/ML IJ SOLN
INTRAMUSCULAR | Status: AC
  Filled 2014-08-10: qty 1

## 2014-08-10 MED ORDER — KCL IN DEXTROSE-NACL 20-5-0.45 MEQ/L-%-% IV SOLN
INTRAVENOUS | Status: DC
  Administered 2014-08-10: 11:00:00 via INTRAVENOUS
  Filled 2014-08-10 (×3): qty 1000

## 2014-08-10 MED ORDER — MEPERIDINE HCL 50 MG/ML IJ SOLN
INTRAMUSCULAR | Status: AC
  Administered 2014-08-10: 12.5 mg
  Filled 2014-08-10: qty 1

## 2014-08-10 MED ORDER — LACTATED RINGERS IV SOLN: INTRAVENOUS | Status: DC

## 2014-08-10 MED ORDER — ONDANSETRON 8 MG PO TBDP
8.0000 mg | ORAL_TABLET | Freq: Once | ORAL | Status: AC
  Administered 2014-08-10: 8 mg via ORAL
  Filled 2014-08-10: qty 1

## 2014-08-10 MED ORDER — NEOSTIGMINE METHYLSULFATE 10 MG/10ML IV SOLN
INTRAVENOUS | Status: DC | PRN
  Administered 2014-08-10: 4 mg via INTRAVENOUS

## 2014-08-10 MED ORDER — PROMETHAZINE HCL 25 MG/ML IJ SOLN
INTRAMUSCULAR | Status: AC
  Filled 2014-08-10: qty 1

## 2014-08-10 MED ORDER — HYDROMORPHONE HCL 1 MG/ML IJ SOLN: 0.2500 mg | INTRAMUSCULAR | Status: DC | PRN

## 2014-08-10 MED ORDER — MEPERIDINE HCL 25 MG/ML IJ SOLN
6.2500 mg | INTRAMUSCULAR | Status: DC | PRN
  Administered 2014-08-10 (×2): 12.5 mg via INTRAVENOUS
  Filled 2014-08-10 (×3): qty 1

## 2014-08-10 MED ORDER — MIDAZOLAM HCL 2 MG/2ML IJ SOLN
INTRAMUSCULAR | Status: AC
  Filled 2014-08-10: qty 2

## 2014-08-10 MED ORDER — BUPIVACAINE-EPINEPHRINE 0.25% -1:200000 IJ SOLN
INTRAMUSCULAR | Status: DC | PRN
  Administered 2014-08-10: 30 mL

## 2014-08-10 MED ORDER — KCL IN DEXTROSE-NACL 20-5-0.45 MEQ/L-%-% IV SOLN
INTRAVENOUS | Status: AC
  Filled 2014-08-10: qty 1000

## 2014-08-10 MED ORDER — ONDANSETRON HCL 4 MG/2ML IJ SOLN
INTRAMUSCULAR | Status: DC | PRN
  Administered 2014-08-10: 4 mg via INTRAVENOUS

## 2014-08-10 MED ORDER — ONDANSETRON HCL 4 MG/2ML IJ SOLN
INTRAMUSCULAR | Status: AC
  Filled 2014-08-10: qty 2

## 2014-08-10 MED ORDER — PROPOFOL 10 MG/ML IV BOLUS
INTRAVENOUS | Status: AC
  Filled 2014-08-10: qty 20

## 2014-08-10 SURGICAL SUPPLY — 30 items
APPLIER CLIP ROT 10 11.4 M/L (STAPLE) ×3
BENZOIN TINCTURE PRP APPL 2/3 (GAUZE/BANDAGES/DRESSINGS) ×3 IMPLANT
CHLORAPREP W/TINT 26ML (MISCELLANEOUS) ×3 IMPLANT
CHOLANGIOGRAM CATH TAUT (CATHETERS) ×3 IMPLANT
CLIP APPLIE ROT 10 11.4 M/L (STAPLE) ×1 IMPLANT
CLOSURE WOUND 1/4X4 (GAUZE/BANDAGES/DRESSINGS) ×1
COVER MAYO STAND STRL (DRAPES) ×3 IMPLANT
DECANTER SPIKE VIAL GLASS SM (MISCELLANEOUS) ×3 IMPLANT
DRAPE C-ARM 42X120 X-RAY (DRAPES) ×3 IMPLANT
DRAPE LAPAROSCOPIC ABDOMINAL (DRAPES) ×3 IMPLANT
ELECT REM PT RETURN 9FT ADLT (ELECTROSURGICAL) ×3
ELECTRODE REM PT RTRN 9FT ADLT (ELECTROSURGICAL) ×1 IMPLANT
GLOVE SURG SIGNA 7.5 PF LTX (GLOVE) ×3 IMPLANT
GOWN STRL REUS W/TWL XL LVL3 (GOWN DISPOSABLE) ×9 IMPLANT
HEMOSTAT SURGICEL 4X8 (HEMOSTASIS) IMPLANT
IV CATH 14GX2 1/4 (CATHETERS) ×3 IMPLANT
IV SET MACRO CATH EXT 6 LUER (IV SETS) ×3 IMPLANT
KIT BASIN OR (CUSTOM PROCEDURE TRAY) ×3 IMPLANT
LIQUID BAND (GAUZE/BANDAGES/DRESSINGS) ×3 IMPLANT
POUCH SPECIMEN RETRIEVAL 10MM (ENDOMECHANICALS) ×3 IMPLANT
SET IRRIG TUBING LAPAROSCOPIC (IRRIGATION / IRRIGATOR) ×3 IMPLANT
SLEEVE XCEL OPT CAN 5 100 (ENDOMECHANICALS) ×3 IMPLANT
STOPCOCK 4 WAY LG BORE MALE ST (IV SETS) ×3 IMPLANT
STRIP CLOSURE SKIN 1/4X4 (GAUZE/BANDAGES/DRESSINGS) ×2 IMPLANT
SUT VIC AB 5-0 PS2 18 (SUTURE) ×3 IMPLANT
TOWEL OR 17X26 10 PK STRL BLUE (TOWEL DISPOSABLE) ×3 IMPLANT
TRAY LAPAROSCOPIC (CUSTOM PROCEDURE TRAY) ×3 IMPLANT
TROCAR BLADELESS OPT 5 100 (ENDOMECHANICALS) ×3 IMPLANT
TROCAR XCEL BLUNT TIP 100MML (ENDOMECHANICALS) ×3 IMPLANT
TROCAR XCEL NON-BLD 11X100MML (ENDOMECHANICALS) ×3 IMPLANT

## 2014-08-10 NOTE — ED Notes (Signed)
Pt transported to OR

## 2014-08-10 NOTE — Anesthesia Procedure Notes (Signed)
Procedure Name: Intubation Date/Time: 08/10/2014 8:58 AM Performed by: Jarvis NewcomerARMISTEAD, Gaberial Cada A Pre-anesthesia Checklist: Patient identified, Timeout performed, Emergency Drugs available, Suction available and Patient being monitored Patient Re-evaluated:Patient Re-evaluated prior to inductionOxygen Delivery Method: Circle system utilized Preoxygenation: Pre-oxygenation with 100% oxygen Intubation Type: IV induction Ventilation: Mask ventilation without difficulty Laryngoscope Size: Mac and 4 Grade View: Grade I Tube type: Oral Tube size: 7.5 mm Number of attempts: 1 Airway Equipment and Method: Stylet Placement Confirmation: ETT inserted through vocal cords under direct vision,  breath sounds checked- equal and bilateral and positive ETCO2 Secured at: 22 cm Tube secured with: Tape Dental Injury: Teeth and Oropharynx as per pre-operative assessment

## 2014-08-10 NOTE — H&P (Addendum)
Re:   Johnny RhymesMitchell S Morillo  "Shawn" DOB:   [birth year] MRN:   [id number]   [facility] Admission Note  ASSESSMENT AND PLAN: 1.  Symptomatic cholelithiasis   I discussed with the patient the indications and risks of gall bladder surgery.  The primary risks of gall bladder surgery include, but are not limited to, bleeding, infection, common bile duct injury, and open surgery.  There is also the risk that the patient may have continued symptoms after surgery.  However, the likelihood of improvement in symptoms and return to the patient's normal status is good. We discussed the typical post-operative recovery course. I tried to answer the patient's questions.  2.  ADHD - takes no meds 3.  Bipolar - takes no meds 4.  Smokes - 1/2 ppd  Chief Complaint  Patient presents with  . Abdominal Pain   REFERRING PHYSICIAN: No PCP Per Patient  HISTORY OF PRESENT ILLNESS: Johnny Campos is a 19 y.o. (DOB: [birth year]) white  male whose primary care physician is No PCP Per Patient and [facility] with abdominal pain.  He has known cholelithiasis. He has seen my partner H. Derrell LollingIngram for cholecystectomy and surgery is planned later this week.  He started having symptoms in Dec 2015.  He has had several attacks of pain.  He said that this one is the worse. He has no fever and no other known GI disease.  He has had no abdominal surgery.  From Dr. Jacinto HalimIngram's note: Past history is significant for diagnosis of bipolar disorder as a child. He doesn't take any medications and is not followed by psychiatrist. Fairly well compensated. Ongoing tobacco abuse. He says he is been diagnoses ADHD. Family history reveals grandmother and aunt had cholecystectomy. Other family members have problems with obesity diabetes hypertension coronary artery disease hyperlipidemia Social history reveals that he has graduated from high school. He lives with his grandmother. He says the grandmother is disabled. 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 Looking for a job. Estranged from his parents. Smokes tobacco.   US - 08/10/2014 - gall stones. WBC - 9,300 - 08/10/2014 T. Bili - 1.8 - 08/10/2014     Past Medical History  Diagnosis Date  . ADHD (attention deficit hyperactivity disorder)   . Bipolar 1 disorder   . Shortness of breath dyspnea     exersion, from smoking     History reviewed. No pertinent past surgical history.    No current facility-administered medications for this encounter.   Current Outpatient Prescriptions  Medication Sig Dispense Refill  . acetaminophen (TYLENOL) 325 MG tablet Take 650 mg by mouth every 6 (six) hours as needed (pain).     Marland Kitchen. ibuprofen (ADVIL,MOTRIN) 200 MG tablet Take 400 mg by mouth every 6 (six) hours as needed for moderate pain.     Marland Kitchen. ondansetron (ZOFRAN ODT) 8 MG disintegrating tablet 8mg  ODT q4 hours prn nausea (Patient not taking: Reported on 08/05/2014) 12 tablet 0  . oxyCODONE-acetaminophen (PERCOCET) 5-325 MG per tablet Take 1-2 tablets by mouth every 6 (six) hours as needed for severe pain. (Patient not taking: Reported on 08/05/2014) 15 tablet 0     No Known Allergies  REVIEW OF SYSTEMS: Skin:  No history of rash.  No history of abnormal moles. Infection:  No history of hepatitis or HIV.  No history of MRSA. Neurologic:  No history of stroke.  No history of seizure.  No history of headaches. Cardiac:  No  history of hypertension. No history of heart disease.  No history of prior cardiac catheterization.  No history of seeing a cardiologist. Pulmonary:  Smokes 1/2 ppd  Endocrine:  No diabetes. No thyroid disease. Gastrointestinal:  No history of stomach disease.  No history of liver disease.  No history of gall bladder disease.  No history of pancreas disease.  No history of colon disease. Urologic:  No history of kidney stones.  No history of bladder infections. Musculoskeletal:  No history of joint or back disease. Hematologic:   No bleeding disorder.  No history of anemia.  Not anticoagulated. Psycho-social:  Bipolar. ADHD.  On no meds.  Not actively seeing anyone  SOCIAL and FAMILY HISTORY: Lives with grandmother. Both his parents have drug problems according to him. I spoke to his grandmother, Antonieta Loveless, on phone.  His step grandfather, Lynne Leader, [phone], is coming to the hospital. Has finished high school, but yet to get degree.  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  PHYSICAL EXAM: BP 91/59 mmHg  Pulse 63  Temp(Src) 99 F (37.2 C) (Oral)  Resp 18  Ht  (1.88 m)  Wt 73.936 kg (163 lb)  BMI 20.92 kg/m2  SpO2 95%  General: Thin WN WM who is alert and generally healthy appearing.  HEENT: Normal. Pupils equal. Neck: Supple. No mass.  No thyroid mass. Lymph Nodes:  No supraclavicular or cervical nodes. Lungs: Clear to auscultation and symmetric breath sounds. Heart:  RRR. No murmur or rub. Abdomen: Soft. No mass. No hernia.  No abdominal scars.  Sore epigastrium without peritoneal signs.   Rectal: Not done. Extremities:  Good strength and ROM  in upper and lower extremities. Neurologic:  Grossly intact to motor and sensory function. Psychiatric: Has normal mood and affect. Behavior is normal.   DATA REVIEWED: Epic  Ovidio Kin, MD,  [facility] [address]  [address]   [place], [place]    [address] Phone:  [phone] FAX:  [phone]

## 2014-08-10 NOTE — ED Notes (Addendum)
Dr Ezzard StandingNewman saw pt and explained procedure and completed consent to surgery. [place] when we are ready

## 2014-08-10 NOTE — ED Notes (Signed)
Informed the pt a urine specimen is needed. 

## 2014-08-10 NOTE — ED Notes (Signed)
Pt belongings given to pt grandfather and was shown where to wait for pt during surgery

## 2014-08-10 NOTE — Anesthesia Preprocedure Evaluation (Addendum)
Anesthesia Evaluation  Patient identified by MRN, date of birth, ID band Patient awake    Reviewed: Allergy & Precautions, H&P , NPO status , Patient's Chart, lab work & pertinent test results  History of Anesthesia Complications (+) PONV  Airway Mallampati: I  TM Distance: >3 FB Neck ROM: full    Dental no notable dental hx. (+) Teeth Intact   Pulmonary Current Smoker,    Pulmonary exam normal       Cardiovascular negative cardio ROS      Neuro/Psych PSYCHIATRIC DISORDERS Bipolar Disorder negative neurological ROS     GI/Hepatic negative GI ROS, Neg liver ROS,   Endo/Other  negative endocrine ROS  Renal/GU negative Renal ROS     Musculoskeletal   Abdominal Normal abdominal exam  (+)   Peds  Hematology negative hematology ROS (+)   Anesthesia Other Findings   Reproductive/Obstetrics negative OB ROS                            Anesthesia Physical Anesthesia Plan  ASA: II  Anesthesia Plan: General   Post-op Pain Management:    Induction: Intravenous  Airway Management Planned: Oral ETT  Additional Equipment:   Intra-op Plan:   Post-operative Plan: Extubation in OR  Informed Consent: I have reviewed the patients History and Physical, chart, labs and discussed the procedure including the risks, benefits and alternatives for the proposed anesthesia with the patient or authorized representative who has indicated his/her understanding and acceptance.   Dental Advisory Given  Plan Discussed with: CRNA and Surgeon  Anesthesia Plan Comments:         Anesthesia Quick Evaluation

## 2014-08-10 NOTE — ED Notes (Addendum)
Pt reports RLQ pain with nausea all day today, became worse the last 2 hours.  Pt reports hx of gallstone, and was dx in December.  Pt reports pain radiates up to his chest.

## 2014-08-10 NOTE — Op Note (Signed)
08/10/2014  10:33 AM  PATIENT:  Johnny Campos, 19 y.o., male, MRN: [id number]  PREOP DIAGNOSIS:  Cholecystitis, cholelithiasis  POSTOP DIAGNOSIS:   Minimal cholecystitis, cholelithiasis  PROCEDURE:   Procedure(s):  LAPAROSCOPIC CHOLECYSTECTOMY WITH INTRAOPERATIVE CHOLANGIOGRAM  SURGEON:   Ovidio Kinavid Haylea Schlichting, M.D.  ASSISTANT:   None  ANESTHESIA:   general  Anesthesiologist: Leilani AbleFranklin Hatchett, MD CRNA: Elisabeth CaraLacey A Armistead, CRNA  General  ASA: 2E  EBL:  minimal  ml  BLOOD ADMINISTERED: none  DRAINS: none   LOCAL MEDICATIONS USED:   30 cc 1/4% marcaine  SPECIMEN:   Gall bladder  COUNTS CORRECT:  YES  INDICATIONS FOR PROCEDURE:  Johnny Campos is a 19 y.o. (DOB: May 20, 1995) white  male whose primary care physician is No PCP Per Patient and comes for cholecystectomy.   The indications and risks of the gall bladder surgery were explained to the patient.  The risks include, but are not limited to, infection, bleeding, common bile duct injury and open surgery.  SURGERY:  The patient [facility].  The abdomen was prepped with chloroprep.  The patient was given 2 gm Ancef at the beginning of the operation.   A time out was held and the surgical checklist run.   An infraumbilical incision was made into the abdominal cavity.  A 12 mm Hasson trocar was inserted into the abdominal cavity through the infraumbilical incision and secured with a 0 Vicryl suture.  Three additional trocars were inserted: a 10 mm trocar in the sub-xiphoid location, a 5 mm trocar in the right mid subcostal area, and a 5 mm trocar in the right lateral subcostal area.   The abdomen was explored and the liver, stomach, and bowel that could be seen were unremarkable.  I saw no other cause for abdominal pain.   The gall bladder had minimal inflammation.  It was grasped, and rotated cephalad.  Disssection was carried down to the gall bladder/cystic duct junction and the cystic duct isolated.   A clip was placed on the gall bladder side of the cystic duct.   An intra-operative cholangiogram was shot.   The intra-operative cholangiogram was shot using a cut off Taut catheter placed through a 14 gauge angiocath in the RUQ.  The Taut catheter was inserted in the cut cystic duct and secured with an endoclip.  A cholangiogram was shot with 6 cc of 1/2 strength Omnipaque.  Using fluoroscopy, the cholangiogram showed the flow of contrast into the common bile duct, up the hepatic radicals, and into the duodenum.  There was no mass or obstruction.  This was a normal intra-operative cholangiogram.   The Taut catheter was removed.  The cystic duct was tripley endoclipped and the cystic artery was identified and clipped.  The gall bladder was bluntly and sharpley dissected from the gall bladder bed.   After the gall bladder was removed from the liver, the gall bladder bed and Triangle of [place] were inspected.  There was no bleeding or bile leak.  The gall bladder was placed in a endocatch bag and delivered through the umbilicus.  The abdomen was irrigated with 800 cc saline.   The trocars were then removed.  I infiltrated 30cc of 1/4% Marcaine into the incisions.  The umbilical port closed with a 0 Vicryl suture and the skin closed with 5-0 Monocryl.   The skin was painted with Dermabond.  The patient's sponge and needle count were correct.  The patient was transported to the  RR in good condition.  Alphonsa Overall, MD, [facility] Pager: [phone] Office phone:  [phone]

## 2014-08-10 NOTE — ED Notes (Signed)
Pt c/o RLQ pain starting today. Recently diagnosed with gallstones in December. Denies precipitating and alleviating factors. Denies emesis but is nauseous. Denies diarrhea or fever. No other c/c.

## 2014-08-10 NOTE — Progress Notes (Signed)
UR completed 

## 2014-08-10 NOTE — Anesthesia Postprocedure Evaluation (Signed)
Anesthesia Post Note  Patient: Johnny Campos  Procedure(s) Performed: Procedure(s) (LRB): LAPAROSCOPIC CHOLECYSTECTOMY WITH INTRAOPERATIVE CHOLANGIOGRAM (N/A)  Anesthesia type: General  Patient location: PACU  Post pain: Pain level controlled  Post assessment: Post-op Vital signs reviewed  Last Vitals:  Filed Vitals:   08/10/14 1130  BP: 127/62  Pulse: 55  Temp: 36.4 C  Resp: 17    Post vital signs: Reviewed  Level of consciousness: sedated  Complications: No apparent anesthesia complications

## 2014-08-10 NOTE — ED Provider Notes (Signed)
CSN: [id number]     Arrival date & time 08/10/14  0016 History   First MD Initiated Contact with Patient 08/10/14 0133     Chief Complaint  Patient presents with  . Abdominal Pain    (Consider location/radiation/quality/duration/timing/severity/associated sxs/prior Treatment) HPI Comments: Patient is a 19 year old male with a history of ADHD, bipolar 1 disorder, and gallstones who presents to the emergency department for right upper quadrant abdominal pain. Patient states that his pain has been waxing and waning over the past 24 hours, but worsened acutely at 2200 yesterday. Patient describes the pain as "jabbing". He states that eating worsens his pain. He last had a ham and cheese sandwich at lunch. No PO intake since this time secondary to pain and nausea. Patient reports associated R sided chest pain. No fever, vomiting, shortness of breath, or urinary symptoms. Patient states he has followed up with [facility] (Dr. Derrell Lolling) and has a scheduled cholecystectomy for 08/13/14.  Patient is a 19 y.o. male presenting with abdominal pain. The history is provided by the patient. No language interpreter was used.  Abdominal Pain Associated symptoms: chest pain and nausea   Associated symptoms: no dysuria, no fever and no vomiting     Past Medical History  Diagnosis Date  . ADHD (attention deficit hyperactivity disorder)   . Bipolar 1 disorder   . Shortness of breath dyspnea     exersion, from smoking   History reviewed. No pertinent past surgical history. History reviewed. No pertinent family history. History  Substance Use Topics  . Smoking status: Current Every Day Smoker -- 0.50 packs/day for 3 years    Types: Cigarettes  . Smokeless tobacco: Not on file  . Alcohol Use: No    Review of Systems  Constitutional: Negative for fever.  Cardiovascular: Positive for chest pain.  Gastrointestinal: Positive for nausea and abdominal pain. Negative for vomiting.  Genitourinary: Negative for dysuria.   Neurological: Negative for syncope.  All other systems reviewed and are negative.   Allergies  Review of patient's allergies indicates no known allergies.  Home Medications   Prior to Admission medications   Medication Sig Start Date End Date Taking? Authorizing Provider  acetaminophen (TYLENOL) 325 MG tablet Take 650 mg by mouth every 6 (six) hours as needed (pain).    Yes Historical Provider, MD  ibuprofen (ADVIL,MOTRIN) 200 MG tablet Take 400 mg by mouth every 6 (six) hours as needed for moderate pain.     Historical Provider, MD  ondansetron (ZOFRAN ODT) 8 MG disintegrating tablet  ODT q4 hours prn nausea Patient not taking: Reported on 08/05/2014 07/08/14   Dahlia Client Muthersbaugh, PA-C  oxyCODONE-acetaminophen (PERCOCET) 5-325 MG per tablet Take 1-2 tablets by mouth every 6 (six) hours as needed for severe pain. Patient not taking: Reported on 08/05/2014 07/08/14   Dahlia Client Muthersbaugh, PA-C   BP 128/60 mmHg  Pulse 62  Temp(Src) 98.3 F (36.8 C)  Resp 18  Ht  (1.88 m)  Wt 163 lb (73.936 kg)  BMI 20.92 kg/m2  SpO2 94%   Physical Exam  Constitutional: He is oriented to person, place, and time. He appears well-developed and well-nourished. No distress.  Nontoxic/nonseptic appearing  HENT:  Head: Normocephalic and atraumatic.  Eyes: Conjunctivae and EOM are normal. No scleral icterus.  Neck: Normal range of motion.  Cardiovascular: Normal rate, regular rhythm and intact distal pulses.   Pulmonary/Chest: Effort normal. No respiratory distress. He has no wheezes. He has no rales.  Respirations even and unlabored  Abdominal: Soft.  He exhibits no distension. There is tenderness. There is no rebound and no guarding.  Soft abdomen with RUQ TTP and positive Murphy's sign. No masses or peritoneal signs.   Musculoskeletal: Normal range of motion.  Neurological: He is alert and oriented to person, place, and time. He exhibits normal muscle tone. Coordination normal.  Skin: Skin is  warm and dry. No rash noted. He is not diaphoretic. No erythema. No pallor.  Psychiatric: He has a normal mood and affect. His behavior is normal.  Nursing note and vitals reviewed.   ED Course  Procedures (including critical care time) Labs Review Labs Reviewed  COMPREHENSIVE METABOLIC PANEL - Abnormal; Notable for the following:    Potassium 3.3 (*)    Total Bilirubin 1.8 (*)    All other components within normal limits  CBC WITH DIFFERENTIAL/PLATELET  LIPASE, BLOOD  URINALYSIS, ROUTINE W REFLEX MICROSCOPIC    Imaging Review [place] Abdomen Limited Ruq  08/10/2014   CLINICAL DATA:  Right upper quadrant abdominal pain, history of gallstones.  EXAM: US ABDOMEN LIMITED - RIGHT UPPER QUADRANT  COMPARISON:  04/29/2014  FINDINGS: Gallbladder:  Contains multiple gallstones, at least 4 discrete stones are seen. No wall thickening visualized, gallbladder wall of 2.5 mm. No sonographic Murphy sign noted.  Common bile duct:  Diameter: 3 mm  Liver:  No focal lesion identified. Within normal limits in parenchymal echogenicity.  IMPRESSION: Cholelithiasis. No findings of acute cholecystitis or biliary dilatation.   Electronically Signed   By: Rubye OaksMelanie  Ehinger M.D.   On: 08/10/2014 04:06     EKG Interpretation None      MDM   Final diagnoses:  RUQ abdominal pain  Cholelithiasis    [age] year old male presents to the emergency department for further evaluation of right upper quadrant pain. Patient has a known history of gallstones with laparoscopic cholecystectomy scheduled [facility]13[facility]. Patient has been persistently symptomatic over ED course. Patient has required 2 mg IV Dilaudid as well as 4 mg IV morphine for pain control. His labs appear stable compared with prior workups. His ultrasound today does not reveal acute cholecystitis, though 4 distinct gallstones are noted.  Given that patient has been persistently symptomatic, I consulted with [facility]Newman  [facility] who will evaluate the patient for admission. Patient to remain NPO.   Filed Vitals:   08/10/14 0244 08/10/14 0300 08/10/14 0330 08/10/14 0400  BP: 130/75 131/67 127/68 128/60  Pulse: 61 60 75 62  Temp:      Resp: 18     Height:      Weight:      SpO2: 97% 98% 99% 94%       Antony MaduraKelly Alvaretta Eisenberger, PA-C 08/10/14 [id number]  Tomasita CrumbleAdeleke Oni, MD 08/10/14 1416

## 2014-08-10 NOTE — ED Notes (Signed)
Ambulated to the BR with steady gait.  Attempted to provide urine sample without success.

## 2014-08-10 NOTE — Transfer of Care (Signed)
Immediate Anesthesia Transfer of Care Note  Patient: Johnny RhymesMitchell S Doolen  Procedure(s) Performed: Procedure(s): LAPAROSCOPIC CHOLECYSTECTOMY WITH INTRAOPERATIVE CHOLANGIOGRAM (N/A)  Patient Location: PACU  Anesthesia Type:General  Level of Consciousness: awake, alert , oriented and patient cooperative  Airway & Oxygen Therapy: Patient Spontanous Breathing and Patient connected to face mask oxygen  Post-op Assessment: Report given to RN, Post -op Vital signs reviewed and stable and Patient moving all extremities  Post vital signs: Reviewed and stable  Last Vitals:  Filed Vitals:   08/10/14 0756  BP: 124/57  Pulse: 63  Temp:   Resp: 16    Complications: No apparent anesthesia complications

## 2014-08-10 NOTE — ED Notes (Signed)
After pushing pain med as ordered, pt states "oh that medicine is already working."  When this nurse returned to re-assess pain, pt states that it only helped a little bit.  When asked to rate his pain, pt states "same at 8."

## 2014-08-11 ENCOUNTER — Ambulatory Visit (HOSPITAL_COMMUNITY): Payer: Medicaid Other

## 2014-08-11 MED ORDER — LORAZEPAM 2 MG/ML IJ SOLN
0.5000 mg | Freq: Three times a day (TID) | INTRAMUSCULAR | Status: DC | PRN
Start: 2014-08-11 — End: 2014-08-12

## 2014-08-11 MED ORDER — OXYCODONE HCL 5 MG PO TABS
5.0000 mg | ORAL_TABLET | ORAL | Status: DC | PRN
  Administered 2014-08-11 (×3): 10 mg via ORAL
  Administered 2014-08-12: 5 mg via ORAL
  Filled 2014-08-11 (×3): qty 2
  Filled 2014-08-11: qty 1

## 2014-08-11 MED ORDER — METHOCARBAMOL 1000 MG/10ML IJ SOLN
1000.0000 mg | Freq: Four times a day (QID) | INTRAVENOUS | Status: DC | PRN
  Filled 2014-08-11: qty 10

## 2014-08-11 MED ORDER — LACTATED RINGERS IV BOLUS (SEPSIS): 1000.0000 mL | Freq: Three times a day (TID) | INTRAVENOUS | Status: DC | PRN

## 2014-08-11 MED ORDER — MAGIC MOUTHWASH
15.0000 mL | Freq: Four times a day (QID) | ORAL | Status: DC | PRN
  Filled 2014-08-11: qty 15

## 2014-08-11 MED ORDER — METHOCARBAMOL 500 MG PO TABS
1000.0000 mg | ORAL_TABLET | Freq: Four times a day (QID) | ORAL | Status: DC | PRN
  Administered 2014-08-11: 1000 mg via ORAL
  Filled 2014-08-11: qty 2

## 2014-08-11 MED ORDER — PROMETHAZINE HCL 25 MG/ML IJ SOLN: 6.2500 mg | INTRAMUSCULAR | Status: DC | PRN

## 2014-08-11 MED ORDER — METOPROLOL TARTRATE 1 MG/ML IV SOLN
5.0000 mg | Freq: Four times a day (QID) | INTRAVENOUS | Status: DC | PRN
  Filled 2014-08-11: qty 5

## 2014-08-11 MED ORDER — MENTHOL 3 MG MT LOZG
1.0000 | LOZENGE | OROMUCOSAL | Status: DC | PRN
  Filled 2014-08-11: qty 9

## 2014-08-11 MED ORDER — SODIUM CHLORIDE 0.9 % IJ SOLN: 3.0000 mL | INTRAMUSCULAR | Status: DC | PRN

## 2014-08-11 MED ORDER — KETOROLAC TROMETHAMINE 15 MG/ML IJ SOLN
30.0000 mg | Freq: Four times a day (QID) | INTRAMUSCULAR | Status: DC
  Administered 2014-08-11 – 2014-08-12 (×5): 30 mg via INTRAVENOUS
  Filled 2014-08-11 (×9): qty 2

## 2014-08-11 MED ORDER — ALUM & MAG HYDROXIDE-SIMETH 200-200-20 MG/5ML PO SUSP: 30.0000 mL | Freq: Four times a day (QID) | ORAL | Status: DC | PRN

## 2014-08-11 MED ORDER — HYDROMORPHONE HCL 1 MG/ML IJ SOLN
0.5000 mg | INTRAMUSCULAR | Status: DC | PRN
  Administered 2014-08-11 (×2): 1 mg via INTRAVENOUS
  Filled 2014-08-11 (×2): qty 1

## 2014-08-11 MED ORDER — ACETAMINOPHEN 650 MG RE SUPP: 650.0000 mg | Freq: Four times a day (QID) | RECTAL | Status: DC | PRN

## 2014-08-11 MED ORDER — SODIUM CHLORIDE 0.9 % IV SOLN: 250.0000 mL | INTRAVENOUS | Status: DC | PRN

## 2014-08-11 MED ORDER — METOPROLOL TARTRATE 12.5 MG HALF TABLET
12.5000 mg | ORAL_TABLET | Freq: Two times a day (BID) | ORAL | Status: DC | PRN
  Filled 2014-08-11: qty 1

## 2014-08-11 MED ORDER — ACETAMINOPHEN 500 MG PO TABS: 1000.0000 mg | ORAL_TABLET | Freq: Three times a day (TID) | ORAL | Status: DC

## 2014-08-11 MED ORDER — BISACODYL 10 MG RE SUPP: 10.0000 mg | Freq: Two times a day (BID) | RECTAL | Status: DC | PRN

## 2014-08-11 MED ORDER — OXYCODONE HCL 5 MG PO TABS: 5.0000 mg | ORAL_TABLET | ORAL | Status: DC | PRN

## 2014-08-11 MED ORDER — TRAMADOL HCL 50 MG PO TABS: 50.0000 mg | ORAL_TABLET | Freq: Four times a day (QID) | ORAL | Status: DC | PRN

## 2014-08-11 MED ORDER — ZOLPIDEM TARTRATE 5 MG PO TABS: 5.0000 mg | ORAL_TABLET | Freq: Every evening | ORAL | Status: DC | PRN

## 2014-08-11 MED ORDER — ACETAMINOPHEN 500 MG PO TABS
500.0000 mg | ORAL_TABLET | Freq: Four times a day (QID) | ORAL | Status: DC | PRN
Start: 2014-08-11 — End: 2014-08-12

## 2014-08-11 MED ORDER — PHENOL 1.4 % MT LIQD
2.0000 | OROMUCOSAL | Status: DC | PRN
  Filled 2014-08-11: qty 177

## 2014-08-11 MED ORDER — SODIUM CHLORIDE 0.9 % IJ SOLN
3.0000 mL | Freq: Two times a day (BID) | INTRAMUSCULAR | Status: DC
  Administered 2014-08-11: 3 mL via INTRAVENOUS

## 2014-08-11 MED ORDER — LIP MEDEX EX OINT
1.0000 "application " | TOPICAL_OINTMENT | Freq: Two times a day (BID) | CUTANEOUS | Status: DC
  Administered 2014-08-11 – 2014-08-12 (×3): 1 via TOPICAL
  Filled 2014-08-11: qty 7

## 2014-08-11 NOTE — Progress Notes (Signed)
Johnny Campos  August 05, 1995 [id number]  Patient Care Team: No Pcp Per Patient as PCP - General (General Practice)  This patient is a 19 y.o.male who calls today for surgical evaluation.   MRI done of spine.  Discussed with radiology.  No abnormalities.  Reassuring.  Discussed with floor nurse Marchelle Folks.  Patient continues to have good pulses.  No skin changes.  Patient claims he cannot feel much or wiggle his toes much.  Hesitant to stand up or weight.  Not able to [place] a.  With catheterized.  We will change his medications around.  If he has persistent symptoms, may consider neurology evaluation.  Have my partner who operated on him evaluate him for comparison...  Patient Active Problem List   Diagnosis Date Noted  . Acute cholecystitis with chronic cholecystitis 08/10/2014  . Symptomatic cholelithiasis 08/10/2014  . Gall bladder disease 08/10/2014    Past Medical History  Diagnosis Date  . ADHD (attention deficit hyperactivity disorder)   . Bipolar 1 disorder   . Shortness of breath dyspnea     exersion, from smoking    History reviewed. No pertinent past surgical history.  History   Social History  . Marital Status: Single    Spouse Name: N/A  . Number of Children: N/A  . Years of Education: N/A   Occupational History  . Not on file.   Social History Main Topics  . Smoking status: Current Every Day Smoker -- 0.50 packs/day for 3 years    Types: Cigarettes  . Smokeless tobacco: Not on file  . Alcohol Use: No  . Drug Use: No  . Sexual Activity: Not on file   Other Topics Concern  . Not on file   Social History Narrative    History reviewed. No pertinent family history.  Current Facility-Administered Medications  Medication Dose Route Frequency Provider Last Rate Last Dose  . 0.9 %  sodium chloride infusion  250 mL Intravenous PRN Karie Soda, MD      . acetaminophen (TYLENOL) suppository 650 mg  650 mg Rectal Q6H PRN Karie Soda, MD      .  acetaminophen (TYLENOL) tablet 500-1,000 mg  500-1,000 mg Oral Q6H PRN Karie Soda, MD      . alum & mag hydroxide-simeth (MAALOX/MYLANTA) 200-200-20 MG/5ML suspension 30 mL  30 mL Oral Q6H PRN Karie Soda, MD      . bisacodyl (DULCOLAX) suppository 10 mg  10 mg Rectal Q12H PRN Karie Soda, MD      . dextrose 5 % and 0.45 % NaCl with KCl 20 mEq/L infusion   Intravenous Continuous Karie Soda, MD 50 mL/hr at 08/11/14 0351    . heparin injection 5,000 Units  5,000 Units Subcutaneous 3 times per day Ovidio Kin, MD   5,000 Units at 08/11/14 0020  . HYDROcodone-acetaminophen (NORCO/VICODIN) 5-325 MG per tablet 1-2 tablet  1-2 tablet Oral Q4H PRN Ovidio Kin, MD   2 tablet at 08/11/14 0020  . HYDROmorphone (DILAUDID) injection 0.5-2 mg  0.5-2 mg Intravenous Q1H PRN Karie Soda, MD      . ibuprofen (ADVIL,MOTRIN) tablet 600 mg  600 mg Oral Q6H PRN Ovidio Kin, MD      . lactated ringers bolus 1,000 mL  1,000 mL Intravenous Q8H PRN Karie Soda, MD      . lactated ringers bolus 1,000 mL  1,000 mL Intravenous Q8H PRN Karie Soda, MD      . lip balm (CARMEX) ointment 1 application  1 application Topical BID Viviann Spare  Jersie Beel, MD      . LORazepam (ATIVAN) injection 0.5-1 mg  0.5-1 mg Intravenous Q8H PRN Karie Soda, MD      . magic mouthwash  15 mL Oral QID PRN Karie Soda, MD      . menthol-cetylpyridinium (CEPACOL) lozenge 3 mg  1 lozenge Oral PRN Karie Soda, MD      . methocarbamol (ROBAXIN) 1,000 mg in dextrose 5 % 50 mL IVPB  1,000 mg Intravenous Q6H PRN Karie Soda, MD      . methocarbamol (ROBAXIN) tablet 1,000 mg  1,000 mg Oral Q6H PRN Karie Soda, MD   1,000 mg at 08/11/14 0406  . metoprolol (LOPRESSOR) injection 5 mg  5 mg Intravenous Q6H PRN Karie Soda, MD      . metoprolol tartrate (LOPRESSOR) tablet 12.5 mg  12.5 mg Oral Q12H PRN Karie Soda, MD      . morphine 2 MG/ML injection 1-3 mg  1-3 mg Intravenous Q2H PRN Ovidio Kin, MD   2 mg at 08/11/14 0406  . nicotine (NICODERM CQ -  dosed in mg/24 hours) patch 14 mg  14 mg Transdermal Daily Karie Soda, MD   14 mg at 08/10/14 1716  . ondansetron (ZOFRAN) tablet 4 mg  4 mg Oral Q6H PRN Ovidio Kin, MD       Or  . ondansetron [employer]) injection 4 mg  4 mg Intravenous Q6H PRN Ovidio Kin, MD   4 mg at 08/10/14 2123  . oxyCODONE (Oxy IR/ROXICODONE) immediate release tablet 5-15 mg  5-15 mg Oral Q4H PRN Karie Soda, MD      . phenol (CHLORASEPTIC) mouth spray 2 spray  2 spray Mouth/Throat PRN Karie Soda, MD      . promethazine (PHENERGAN) injection 6.25-12.5 mg  6.25-12.5 mg Intravenous Q4H PRN Karie Soda, MD      . sodium chloride 0.9 % injection 3 mL  3 mL Intravenous Q12H Karie Soda, MD      . sodium chloride 0.9 % injection 3 mL  3 mL Intravenous PRN Karie Soda, MD      . zolpidem (AMBIEN) tablet 5-10 mg  5-10 mg Oral QHS PRN Karie Soda, MD         No Known Allergies  BP 122/72 mmHg  Pulse 68  Temp(Src) 98.5 F (36.9 C) (Oral)  Resp 24  Ht  (1.88 m)  Wt 73.936 kg (163 lb)  BMI 20.92 kg/m2  SpO2 100%  Dg Cholangiogram Operative  08/10/2014   CLINICAL DATA:  Intraoperative cholangiogram  EXAM: INTRAOPERATIVE CHOLANGIOGRAM  TECHNIQUE: Cholangiographic images from the C-arm fluoroscopic device were submitted for interpretation post-operatively. Please see the procedural report for the amount of contrast and the fluoroscopy time utilized.  COMPARISON:  Preoperative abdominal ultrasound 07/08/2014  FINDINGS: A single intraprocedural fluoroscopic run demonstrates opacification of the normal caliber common and intrahepatic ducts without filling defect, focal caliber cut off, or extrinsic mass effect identified. Cholecystectomy clips are identified. Passage of contrast into the duodenum is identified.  IMPRESSION: Intraoperative cholangiogram as above.   Electronically Signed   By: Christiana Pellant M.D.   On: 08/10/2014 10:06   Mr Thoracic Spine Wo Contrast  08/11/2014   CLINICAL DATA:  Initial evaluation for  sudden onset numbness in the lower extremities bilaterally. Also with difficulty voiding. Patient is status post laparoscopic cholecystectomy on 08/10/2014.  EXAM: MRI THORACIC AND LUMBAR SPINE WITHOUT CONTRAST  TECHNIQUE: Multiplanar and multiecho pulse sequences of the thoracic and lumbar spine were obtained without intravenous contrast.  COMPARISON:  None available.  FINDINGS: MR THORACIC SPINE FINDINGS  The vertebral bodies are normally aligned with preservation of the normal thoracic kyphosis. Vertebral body heights are well preserved. Signal intensity within the vertebral body bone marrow is normal. No focal osseous lesion.  Signal intensity within the thoracic spinal cord is normal. No abnormal foci to suggest cord infarct or other acute abnormality. No spinal cord edema.  Mild degenerative disc desiccation present at T9-10 with minimal disc bulge. No other significant degenerative changes identified within the thoracic spine. No disc herniation. No significant canal or foraminal stenosis.  Paraspinous soft tissues are within normal limits. Visualized lungs are clear. Visualized visceral structures unremarkable.  MR LUMBAR SPINE FINDINGS  Vertebral bodies are normally aligned with preservation of the normal lumbar lordosis. Vertebral body heights are well preserved. Signal intensity within the vertebral body bone marrow is normal. No focal osseous lesion.  The conus medullaris terminates normally at the L1 level. Signal intensity within the visualized cord is normal. Nerve roots of the cauda equina are unremarkable.  No paraspinous soft tissue abnormality.  No significant degenerative disc disease identified within the lumbar spine. Intervertebral discs are well hydrated. No disc bulge or disc protrusion. There is very mild early facet and ligamentous hypertrophy at L4-5. No other significant facet disease.  IMPRESSION: MR THORACIC SPINE IMPRESSION  1. Normal MRI appearance of the thoracic spinal cord. 2.  Mild degenerative disc desiccation and disc bulge at T9-10 without stenosis. No other significant degenerative changes within the thoracic spine. No canal or foraminal stenosis.  MR LUMBAR SPINE IMPRESSION  1. No acute abnormality within the lumbar spine. Normal appearance of the conus medullaris and nerve roots of the cauda equina. No evidence for cord compression. 2. Mild/early facet and ligamentous hypertrophy at L4-5. No other significant degenerative changes identified within the lumbar spine. No canal or foraminal stenosis.   Electronically Signed   By: Rise Mu M.D.   On: 08/11/2014 04:28   Mr Lumbar Spine Wo Contrast  08/11/2014   CLINICAL DATA:  Initial evaluation for sudden onset numbness in the lower extremities bilaterally. Also with difficulty voiding. Patient is status post laparoscopic cholecystectomy on 08/10/2014.  EXAM: MRI THORACIC AND LUMBAR SPINE WITHOUT CONTRAST  TECHNIQUE: Multiplanar and multiecho pulse sequences of the thoracic and lumbar spine were obtained without intravenous contrast.  COMPARISON:  None available.  FINDINGS: MR THORACIC SPINE FINDINGS  The vertebral bodies are normally aligned with preservation of the normal thoracic kyphosis. Vertebral body heights are well preserved. Signal intensity within the vertebral body bone marrow is normal. No focal osseous lesion.  Signal intensity within the thoracic spinal cord is normal. No abnormal foci to suggest cord infarct or other acute abnormality. No spinal cord edema.  Mild degenerative disc desiccation present at T9-10 with minimal disc bulge. No other significant degenerative changes identified within the thoracic spine. No disc herniation. No significant canal or foraminal stenosis.  Paraspinous soft tissues are within normal limits. Visualized lungs are clear. Visualized visceral structures unremarkable.  MR LUMBAR SPINE FINDINGS  Vertebral bodies are normally aligned with preservation of the normal lumbar  lordosis. Vertebral body heights are well preserved. Signal intensity within the vertebral body bone marrow is normal. No focal osseous lesion.  The conus medullaris terminates normally at the L1 level. Signal intensity within the visualized cord is normal. Nerve roots of the cauda equina are unremarkable.  No paraspinous soft tissue abnormality.  No significant degenerative disc disease identified within the lumbar  spine. Intervertebral discs are well hydrated. No disc bulge or disc protrusion. There is very mild early facet and ligamentous hypertrophy at L4-5. No other significant facet disease.  IMPRESSION: MR THORACIC SPINE IMPRESSION  1. Normal MRI appearance of the thoracic spinal cord. 2. Mild degenerative disc desiccation and disc bulge at T9-10 without stenosis. No other significant degenerative changes within the thoracic spine. No canal or foraminal stenosis.  MR LUMBAR SPINE IMPRESSION  1. No acute abnormality within the lumbar spine. Normal appearance of the conus medullaris and nerve roots of the cauda equina. No evidence for cord compression. 2. Mild/early facet and ligamentous hypertrophy at L4-5. No other significant degenerative changes identified within the lumbar spine. No canal or foraminal stenosis.   Electronically Signed   By: Rise MuBenjamin  McClintock M.D.   On: 08/11/2014 04:28   [place] Abdomen Limited Ruq  08/10/2014   CLINICAL DATA:  Right upper quadrant abdominal pain, history of gallstones.  EXAM: US ABDOMEN LIMITED - RIGHT UPPER QUADRANT  COMPARISON:  04/29/2014  FINDINGS: Gallbladder:  Contains multiple gallstones, at least 4 discrete stones are seen. No wall thickening visualized, gallbladder wall of 2.5 mm. No sonographic Murphy sign noted.  Common bile duct:  Diameter: 3 mm  Liver:  No focal lesion identified. Within normal limits in parenchymal echogenicity.  IMPRESSION: Cholelithiasis. No findings of acute cholecystitis or biliary dilatation.   Electronically Signed   By: Rubye OaksMelanie   Ehinger M.D.   On: 08/10/2014 04:06    Note: This dictation was prepared with Dragon/digital dictation along with [employer] technology. Any transcriptional errors that result from this process are unintentional.

## 2014-08-11 NOTE — Progress Notes (Signed)
Johnny Campos  05/19/1995 [id number]  Patient Care Team: No Pcp Per Patient as PCP - General (General Practice)  This patient is a 19 y.o.male who calls today for surgical evaluation.   Reason for call: LEG NUMBNESS  Patient complaining of numbness in legs below the knees.  Nurse concerned.  Called rapid response.  Patient with normal equal pulses.  Legs warm.  No ischemia.  No blotching.  No petechia.  No gangrene.  No redness.  Patient speaking normally.  No difficulty with comprehension.  No focal weakness on one side or the other.  He does smoke.  No history of prior problems.  Does have attention deficit hyperactivity disorder and bipolar disorder.  He was walking in the hallways earlier in the day without much difficulty.  He is been eating fine with no nausea or vomiting.  No diarrhea.  No incontinence to urine or stool.  No history of seizures.  No fall  I recommended leg elevations and warm compresses.  Follow exam for the next hour and see if resolves.  Perhaps is just irritated or pinched off nerve.     I called back an hour later.  Patient able to move his toes somewhat but still with discomfort and some numbness on toes.  Complaining of back pain as well.  No history of prior back surgery.  I recommended warm compresses to back.  Pain control.  Radiology evaluation of spine.  Hold off on any muscle relaxants for high-dose nonsteroidals for now until etiology better ruled in/ruled out.  Discussed with Dr. Kearney Hard with radiology numerous times.  He recommended MRI of spine for evaluation to rule out tension or for disc.  Given sudden neurological change, recommended it be done as soon as possible.  Because it is a holiday weekend in the middle the night, will transfer patient for MRI there.  Patient Active Problem List   Diagnosis Date Noted  . Acute cholecystitis with chronic cholecystitis 08/10/2014  . Symptomatic cholelithiasis 08/10/2014  . Gall bladder disease 08/10/2014     Past Medical History  Diagnosis Date  . ADHD (attention deficit hyperactivity disorder)   . Bipolar 1 disorder   . Shortness of breath dyspnea     exersion, from smoking    History reviewed. No pertinent past surgical history.  History   Social History  . Marital Status: Single    Spouse Name: N/A  . Number of Children: N/A  . Years of Education: N/A   Occupational History  . Not on file.   Social History Main Topics  . Smoking status: Current Every Day Smoker -- 0.50 packs/day for 3 years    Types: Cigarettes  . Smokeless tobacco: Not on file  . Alcohol Use: No  . Drug Use: No  . Sexual Activity: Not on file   Other Topics Concern  . Not on file   Social History Narrative    History reviewed. No pertinent family history.  Current Facility-Administered Medications  Medication Dose Route Frequency Provider Last Rate Last Dose  . 0.9 %  sodium chloride infusion  250 mL Intravenous PRN Karie Soda, MD      . acetaminophen (TYLENOL) suppository 650 mg  650 mg Rectal Q6H PRN Karie Soda, MD      . acetaminophen (TYLENOL) tablet 500-1,000 mg  500-1,000 mg Oral Q6H PRN Karie Soda, MD      . dextrose 5 % and 0.45 % NaCl with KCl 20 mEq/L infusion   Intravenous Continuous Viviann Spare  Kyung Muto, MD 100 mL/hr at 08/10/14 1129    . heparin injection 5,000 Units  5,000 Units Subcutaneous 3 times per day Ovidio Kin, MD   5,000 Units at 08/11/14 0020  . HYDROcodone-acetaminophen (NORCO/VICODIN) 5-325 MG per tablet 1-2 tablet  1-2 tablet Oral Q4H PRN Ovidio Kin, MD   2 tablet at 08/11/14 0020  . HYDROmorphone (DILAUDID) injection 0.5-2 mg  0.5-2 mg Intravenous Q1H PRN Karie Soda, MD      . ibuprofen (ADVIL,MOTRIN) tablet 600 mg  600 mg Oral Q6H PRN Ovidio Kin, MD      . lactated ringers bolus 1,000 mL  1,000 mL Intravenous Q8H PRN Karie Soda, MD      . methocarbamol (ROBAXIN) 1,000 mg in dextrose 5 % 50 mL IVPB  1,000 mg Intravenous Q6H PRN Karie Soda, MD      .  methocarbamol (ROBAXIN) tablet 1,000 mg  1,000 mg Oral Q6H PRN Karie Soda, MD      . morphine 2 MG/ML injection 1-3 mg  1-3 mg Intravenous Q2H PRN Ovidio Kin, MD   2 mg at 08/10/14 2124  . nicotine (NICODERM CQ - dosed in mg/24 hours) patch 14 mg  14 mg Transdermal Daily Karie Soda, MD   14 mg at 08/10/14 1716  . ondansetron (ZOFRAN) tablet 4 mg  4 mg Oral Q6H PRN Ovidio Kin, MD       Or  . ondansetron [facility]) injection 4 mg  4 mg Intravenous Q6H PRN Ovidio Kin, MD   4 mg at 08/10/14 2123  . oxyCODONE (Oxy IR/ROXICODONE) immediate release tablet 5-15 mg  5-15 mg Oral Q4H PRN Karie Soda, MD      . sodium chloride 0.9 % injection 3 mL  3 mL Intravenous Q12H Karie Soda, MD      . sodium chloride 0.9 % injection 3 mL  3 mL Intravenous PRN Karie Soda, MD         No Known Allergies  BP 128/74 mmHg  Pulse 57  Temp(Src) 98.5 F (36.9 C) (Oral)  Resp 18  Ht  (1.88 m)  Wt 73.936 kg (163 lb)  BMI 20.92 kg/m2  SpO2 100%  Dg Cholangiogram Operative  08/10/2014   CLINICAL DATA:  Intraoperative cholangiogram  EXAM: INTRAOPERATIVE CHOLANGIOGRAM  TECHNIQUE: Cholangiographic images from the C-arm fluoroscopic device were submitted for interpretation post-operatively. Please see the procedural report for the amount of contrast and the fluoroscopy time utilized.  COMPARISON:  Preoperative abdominal ultrasound 07/08/2014  FINDINGS: A single intraprocedural fluoroscopic run demonstrates opacification of the normal caliber common and intrahepatic ducts without filling defect, focal caliber cut off, or extrinsic mass effect identified. Cholecystectomy clips are identified. Passage of contrast into the duodenum is identified.  IMPRESSION: Intraoperative cholangiogram as above.   Electronically Signed   By: Christiana Pellant M.D.   On: 08/10/2014 10:06   US Abdomen Limited Ruq  08/10/2014   CLINICAL DATA:  Right upper quadrant abdominal pain, history of gallstones.  EXAM: US ABDOMEN LIMITED -  RIGHT UPPER QUADRANT  COMPARISON:  04/29/2014  FINDINGS: Gallbladder:  Contains multiple gallstones, at least 4 discrete stones are seen. No wall thickening visualized, gallbladder wall of 2.5 mm. No sonographic Murphy sign noted.  Common bile duct:  Diameter: 3 mm  Liver:  No focal lesion identified. Within normal limits in parenchymal echogenicity.  IMPRESSION: Cholelithiasis. No findings of acute cholecystitis or biliary dilatation.   Electronically Signed   By: Rubye Oaks M.D.   On: 08/10/2014 04:06  Note: This dictation was prepared with Dragon/digital dictation along with [employer] technology. Any transcriptional errors that result from this process are unintentional.

## 2014-08-11 NOTE — Progress Notes (Signed)
Pt suddenly complaining of numbness in both legs from the knee down. Pt could not feel any sensation or touching of either leg from knee down, could not wiggle toes or flex feet at all. Pedal pulse was present in both feet and legs/feet were warm to touch. Rapid  Response called to evaluate pt. Johnny BertholdLemons, [place]ard[place] King

## 2014-08-11 NOTE — Progress Notes (Signed)
General Surgery Note   POD -  1 Day Post-Op  Assessment/Plan: 1.  CholecYSTECTOMY WITH INTRAOPERATIVE CHOLANGIOGRAM - Johnny Campos - 08/10/2014  Will all that went on with lower extremity numbness - unlikely to get home today   2. ADHD - takes no meds 3. Bipolar - takes no meds 4. Smokes - 1/2 ppd 5.  DVT prophylaxis - SQ heparin 6.  Complained of not being able to move his legs last PM -   MRI of spine okay  He stood on his own for me today 7.  Urinary retention  To get patient to void   Principal Problem:   Acute cholecystitis with chronic cholecystitis Active Problems:   Symptomatic cholelithiasis   Gall bladder disease  Subjective:  Worried about legs.  They worked fine while I was there.  No one in room with patient.  Objective:   Filed Vitals:   08/11/14 0617  BP: 132/78  Pulse: 74  Temp: 98.5 F (36.9 C)  Resp: 20     Intake/Output from previous day:  03/26 0701 - 03/27 0700 In: 4242.5 [P.O.:1200; I.V.:3042.5] Out: 4900 [Urine:4900]  Intake/Output this shift:      Physical Exam:   General: WN thin young male who is alert and oriented.    HEENT: Normal. Pupils equal. .   Lungs: Clear   Abdomen: Soft.  BS present.   Wound: Clean.     Lab Results:    Recent Labs  08/10/14 0108  WBC 9.3  HGB 14.0  HCT 41.1  PLT 215    BMET   Recent Labs  08/10/14 0108  NA 138  K 3.3*  CL 107  CO2 22  GLUCOSE 91  BUN 16  CREATININE 0.73  CALCIUM 9.7    PT/INR  No results for input(s): LABPROT, INR in the last 72 hours.  ABG  No results for input(s): PHART, HCO3 in the last 72 hours.  Invalid input(s): PCO2, PO2   Studies/Results:  Dg Cholangiogram Operative  08/10/2014   CLINICAL DATA:  Intraoperative cholangiogram  EXAM: INTRAOPERATIVE CHOLANGIOGRAM  TECHNIQUE: Cholangiographic images from the C-arm fluoroscopic device were submitted for interpretation post-operatively. Please see the procedural report for the amount of contrast and the  fluoroscopy time utilized.  COMPARISON:  Preoperative abdominal ultrasound 07/08/2014  FINDINGS: A single intraprocedural fluoroscopic run demonstrates opacification of the normal caliber common and intrahepatic ducts without filling defect, focal caliber cut off, or extrinsic mass effect identified. Cholecystectomy clips are identified. Passage of contrast into the duodenum is identified.  IMPRESSION: Intraoperative cholangiogram as above.   Electronically Signed   By: Christiana PellantGretchen  Green M.D.   On: 08/10/2014 10:06   Mr Thoracic Spine Wo Contrast  08/11/2014   CLINICAL DATA:  Initial evaluation for sudden onset numbness in the lower extremities bilaterally. Also with difficulty voiding. Patient is status post laparoscopic cholecystectomy on 08/10/2014.  EXAM: MRI THORACIC AND LUMBAR SPINE WITHOUT CONTRAST  TECHNIQUE: Multiplanar and multiecho pulse sequences of the thoracic and lumbar spine were obtained without intravenous contrast.  COMPARISON:  None available.  FINDINGS: MR THORACIC SPINE FINDINGS  The vertebral bodies are normally aligned with preservation of the normal thoracic kyphosis. Vertebral body heights are well preserved. Signal intensity within the vertebral body bone marrow is normal. No focal osseous lesion.  Signal intensity within the thoracic spinal cord is normal. No abnormal foci to suggest cord infarct or other acute abnormality. No spinal cord edema.  Mild degenerative disc desiccation present at T9-10 with minimal  disc bulge. No other significant degenerative changes identified within the thoracic spine. No disc herniation. No significant canal or foraminal stenosis.  Paraspinous soft tissues are within normal limits. Visualized lungs are clear. Visualized visceral structures unremarkable.  MR LUMBAR SPINE FINDINGS  Vertebral bodies are normally aligned with preservation of the normal lumbar lordosis. Vertebral body heights are well preserved. Signal intensity within the vertebral body bone  marrow is normal. No focal osseous lesion.  The conus medullaris terminates normally at the L1 level. Signal intensity within the visualized cord is normal. Nerve roots of the cauda equina are unremarkable.  No paraspinous soft tissue abnormality.  No significant degenerative disc disease identified within the lumbar spine. Intervertebral discs are well hydrated. No disc bulge or disc protrusion. There is very mild early facet and ligamentous hypertrophy at L4-5. No other significant facet disease.  IMPRESSION: MR THORACIC SPINE IMPRESSION  1. Normal MRI appearance of the thoracic spinal cord. 2. Mild degenerative disc desiccation and disc bulge at T9-10 without stenosis. No other significant degenerative changes within the thoracic spine. No canal or foraminal stenosis.  MR LUMBAR SPINE IMPRESSION  1. No acute abnormality within the lumbar spine. Normal appearance of the conus medullaris and nerve roots of the cauda equina. No evidence for cord compression. 2. Mild/early facet and ligamentous hypertrophy at L4-5. No other significant degenerative changes identified within the lumbar spine. No canal or foraminal stenosis.   Electronically Signed   By: Rise Mu M.D.   On: 08/11/2014 04:28   Mr Lumbar Spine Wo Contrast  08/11/2014   CLINICAL DATA:  Initial evaluation for sudden onset numbness in the lower extremities bilaterally. Also with difficulty voiding. Patient is status post laparoscopic cholecystectomy on 08/10/2014.  EXAM: MRI THORACIC AND LUMBAR SPINE WITHOUT CONTRAST  TECHNIQUE: Multiplanar and multiecho pulse sequences of the thoracic and lumbar spine were obtained without intravenous contrast.  COMPARISON:  None available.  FINDINGS: MR THORACIC SPINE FINDINGS  The vertebral bodies are normally aligned with preservation of the normal thoracic kyphosis. Vertebral body heights are well preserved. Signal intensity within the vertebral body bone marrow is normal. No focal osseous lesion.   Signal intensity within the thoracic spinal cord is normal. No abnormal foci to suggest cord infarct or other acute abnormality. No spinal cord edema.  Mild degenerative disc desiccation present at T9-10 with minimal disc bulge. No other significant degenerative changes identified within the thoracic spine. No disc herniation. No significant canal or foraminal stenosis.  Paraspinous soft tissues are within normal limits. Visualized lungs are clear. Visualized visceral structures unremarkable.  MR LUMBAR SPINE FINDINGS  Vertebral bodies are normally aligned with preservation of the normal lumbar lordosis. Vertebral body heights are well preserved. Signal intensity within the vertebral body bone marrow is normal. No focal osseous lesion.  The conus medullaris terminates normally at the L1 level. Signal intensity within the visualized cord is normal. Nerve roots of the cauda equina are unremarkable.  No paraspinous soft tissue abnormality.  No significant degenerative disc disease identified within the lumbar spine. Intervertebral discs are well hydrated. No disc bulge or disc protrusion. There is very mild early facet and ligamentous hypertrophy at L4-5. No other significant facet disease.  IMPRESSION: MR THORACIC SPINE IMPRESSION  1. Normal MRI appearance of the thoracic spinal cord. 2. Mild degenerative disc desiccation and disc bulge at T9-10 without stenosis. No other significant degenerative changes within the thoracic spine. No canal or foraminal stenosis.  MR LUMBAR SPINE IMPRESSION  1. No acute  abnormality within the lumbar spine. Normal appearance of the conus medullaris and nerve roots of the cauda equina. No evidence for cord compression. 2. Mild/early facet and ligamentous hypertrophy at L4-5. No other significant degenerative changes identified within the lumbar spine. No canal or foraminal stenosis.   Electronically Signed   By: Rise Mu M.D.   On: 08/11/2014 04:28   US Abdomen Limited  Ruq  08/10/2014   CLINICAL DATA:  Right upper quadrant abdominal pain, history of gallstones.  EXAM: US ABDOMEN LIMITED - RIGHT UPPER QUADRANT  COMPARISON:  04/29/2014  FINDINGS: Gallbladder:  Contains multiple gallstones, at least 4 discrete stones are seen. No wall thickening visualized, gallbladder wall of 2.5 mm. No sonographic Murphy sign noted.  Common bile duct:  Diameter: 3 mm  Liver:  No focal lesion identified. Within normal limits in parenchymal echogenicity.  IMPRESSION: Cholelithiasis. No findings of acute cholecystitis or biliary dilatation.   Electronically Signed   By: Rubye Oaks M.D.   On: 08/10/2014 04:06     Anti-infectives:   Anti-infectives    None      Ovidio Kin, MD, FACS Pager: [phone] Surgery Office: [phone] 08/11/2014

## 2014-08-11 NOTE — Progress Notes (Signed)
Pt complaining of urgency to void but unable to void. Bladder scan showed 800cc, I and O cath done and 1000 cc removed.  Johnny Campos, Johnny Campos

## 2014-08-11 NOTE — Significant Event (Signed)
Rapid Response Event Note  Overview: Time Called: 2230 Arrival Time: 2240 Event Type: Other (Comment), Neurologic (changes in sensation in lower extremities and  inability to move)  Initial Focused Assessment:Patient was c/o inability to move his legs from the knee to foot bilaterally. Symptoms began with numbness bilaterally after going to the bathroom. Upon trying to stand he was unable to bear weight. Gradually lumbar back pain began and increased. He had no sensation of touch bilateral lower extremities from above the knee to foot. He was unable to wiggle toes or flex knees. Pulses were strong and lower legs were warm. He attempted to move foot midline and had increased pain in lower back as well as was unable.   Interventions:MD notified and legs elevated with neuro and vascular checks. V/S stable. Discussed any history of falls, MVA, or possible sports injury.    Event Summary: Name of Physician Notified: Dr Michaell CowingGross at 2245    at    Outcome: Stayed in room and stabalized  Event End Time: 0040  Bayard HuggerDenny, Geraldin Habermehl B

## 2014-08-12 ENCOUNTER — Encounter (HOSPITAL_COMMUNITY): Payer: Self-pay | Admitting: Surgery

## 2014-08-12 MED ORDER — CHLORHEXIDINE GLUCONATE 4 % EX LIQD
1.0000 "application " | Freq: Once | CUTANEOUS | Status: DC
  Filled 2014-08-12: qty 15

## 2014-08-12 MED ORDER — OXYCODONE HCL 5 MG PO TABS: 5.0000 mg | ORAL_TABLET | ORAL | Status: DC | PRN

## 2014-08-12 MED ORDER — CEFAZOLIN SODIUM-DEXTROSE 2-3 GM-% IV SOLR: 2.0000 g | INTRAVENOUS | Status: DC

## 2014-08-12 NOTE — Progress Notes (Signed)
Pt asked writer to in and out cath him. Writer stated that if he did not start to void on his own, he may have to go home with a foley cath. Pt immediately walked to the bathroom and voided 900cc. Will continue to monitor.

## 2014-08-12 NOTE — Progress Notes (Signed)
Discharge instructions given to pt and pt's parents with all questions answered. Teaching accepted.  

## 2014-08-12 NOTE — Discharge Instructions (Signed)

## 2014-08-12 NOTE — Discharge Summary (Signed)
Physician Discharge Summary  Patient ID: LUCIANO CINQUEMANI MRN: [id number] DOB/AGE: January 31, 1996 19 y.o.  Admit date: 08/10/2014 Discharge date: 08/12/2014  Admitting Diagnosis: Symptomatic cholelithiasis   Discharge Diagnosis Patient Active Problem List   Diagnosis Date Noted  . Acute cholecystitis with chronic cholecystitis 08/10/2014  . Symptomatic cholelithiasis 08/10/2014  . Gall bladder disease 08/10/2014    Consultants none  Imaging: Mr Thoracic Spine Wo Contrast  08/11/2014   CLINICAL DATA:  Initial evaluation for sudden onset numbness in the lower extremities bilaterally. Also with difficulty voiding. Patient is status post laparoscopic cholecystectomy on 08/10/2014.  EXAM: MRI THORACIC AND LUMBAR SPINE WITHOUT CONTRAST  TECHNIQUE: Multiplanar and multiecho pulse sequences of the thoracic and lumbar spine were obtained without intravenous contrast.  COMPARISON:  None available.  FINDINGS: MR THORACIC SPINE FINDINGS  The vertebral bodies are normally aligned with preservation of the normal thoracic kyphosis. Vertebral body heights are well preserved. Signal intensity within the vertebral body bone marrow is normal. No focal osseous lesion.  Signal intensity within the thoracic spinal cord is normal. No abnormal foci to suggest cord infarct or other acute abnormality. No spinal cord edema.  Mild degenerative disc desiccation present at T9-10 with minimal disc bulge. No other significant degenerative changes identified within the thoracic spine. No disc herniation. No significant canal or foraminal stenosis.  Paraspinous soft tissues are within normal limits. Visualized lungs are clear. Visualized visceral structures unremarkable.  MR LUMBAR SPINE FINDINGS  Vertebral bodies are normally aligned with preservation of the normal lumbar lordosis. Vertebral body heights are well preserved. Signal intensity within the vertebral body bone marrow is normal. No focal osseous lesion.  The conus  medullaris terminates normally at the L1 level. Signal intensity within the visualized cord is normal. Nerve roots of the cauda equina are unremarkable.  No paraspinous soft tissue abnormality.  No significant degenerative disc disease identified within the lumbar spine. Intervertebral discs are well hydrated. No disc bulge or disc protrusion. There is very mild early facet and ligamentous hypertrophy at L4-5. No other significant facet disease.  IMPRESSION: MR THORACIC SPINE IMPRESSION  1. Normal MRI appearance of the thoracic spinal cord. 2. Mild degenerative disc desiccation and disc bulge at T9-10 without stenosis. No other significant degenerative changes within the thoracic spine. No canal or foraminal stenosis.  MR LUMBAR SPINE IMPRESSION  1. No acute abnormality within the lumbar spine. Normal appearance of the conus medullaris and nerve roots of the cauda equina. No evidence for cord compression. 2. Mild/early facet and ligamentous hypertrophy at L4-5. No other significant degenerative changes identified within the lumbar spine. No canal or foraminal stenosis.   Electronically Signed   By: Rise Mu M.D.   On: 08/11/2014 04:28   Mr Lumbar Spine Wo Contrast  08/11/2014   CLINICAL DATA:  Initial evaluation for sudden onset numbness in the lower extremities bilaterally. Also with difficulty voiding. Patient is status post laparoscopic cholecystectomy on 08/10/2014.  EXAM: MRI THORACIC AND LUMBAR SPINE WITHOUT CONTRAST  TECHNIQUE: Multiplanar and multiecho pulse sequences of the thoracic and lumbar spine were obtained without intravenous contrast.  COMPARISON:  None available.  FINDINGS: MR THORACIC SPINE FINDINGS  The vertebral bodies are normally aligned with preservation of the normal thoracic kyphosis. Vertebral body heights are well preserved. Signal intensity within the vertebral body bone marrow is normal. No focal osseous lesion.  Signal intensity within the thoracic spinal cord is normal.  No abnormal foci to suggest cord infarct or other acute abnormality. No spinal  cord edema.  Mild degenerative disc desiccation present at T9-10 with minimal disc bulge. No other significant degenerative changes identified within the thoracic spine. No disc herniation. No significant canal or foraminal stenosis.  Paraspinous soft tissues are within normal limits. Visualized lungs are clear. Visualized visceral structures unremarkable.  MR LUMBAR SPINE FINDINGS  Vertebral bodies are normally aligned with preservation of the normal lumbar lordosis. Vertebral body heights are well preserved. Signal intensity within the vertebral body bone marrow is normal. No focal osseous lesion.  The conus medullaris terminates normally at the L1 level. Signal intensity within the visualized cord is normal. Nerve roots of the cauda equina are unremarkable.  No paraspinous soft tissue abnormality.  No significant degenerative disc disease identified within the lumbar spine. Intervertebral discs are well hydrated. No disc bulge or disc protrusion. There is very mild early facet and ligamentous hypertrophy at L4-5. No other significant facet disease.  IMPRESSION: MR THORACIC SPINE IMPRESSION  1. Normal MRI appearance of the thoracic spinal cord. 2. Mild degenerative disc desiccation and disc bulge at T9-10 without stenosis. No other significant degenerative changes within the thoracic spine. No canal or foraminal stenosis.  MR LUMBAR SPINE IMPRESSION  1. No acute abnormality within the lumbar spine. Normal appearance of the conus medullaris and nerve roots of the cauda equina. No evidence for cord compression. 2. Mild/early facet and ligamentous hypertrophy at L4-5. No other significant degenerative changes identified within the lumbar spine. No canal or foraminal stenosis.   Electronically Signed   By: Rise MuBenjamin  McClintock M.D.   On: 08/11/2014 04:28    Procedures Laparoscopic cholecystectomy with IOC---[facility]  Course:  Su HoffMitchell Celmer is a 19 year old male with known history of cholelithiasis scheduled for surgery with Dr. Derrell LollingIngram this week [facility].  Workup showed gallstones.  Patient was admitted and underwent procedure listed above.  Tolerated procedure well and was transferred to the floor.  On POD#1, the patient complained on numbness to BLEs and MRI was negative for acute abnormalities and he spontaneously regained mobility.    Diet was advanced as tolerated.  On POD#2, the patient was voiding well, tolerating diet, ambulating well, pain well controlled, vital signs stable, incisions c/d/i and felt stable for discharge home.  Patient will follow up in our office in 3 weeks and knows to call with questions or concerns.  Physical Exam: General:  Alert, NAD, pleasant, comfortable Abd:  Soft, ND, mild tenderness, incisions C/D/I     Medication List    STOP taking these medications        oxyCODONE-acetaminophen 5-325 MG per tablet  Commonly known as:  PERCOCET      TAKE these medications        acetaminophen 325 MG tablet  Commonly known as:  TYLENOL  Take 650 mg by mouth every 6 (six) hours as needed (pain).     ibuprofen 200 MG tablet  Commonly known as:  ADVIL,MOTRIN  Take 400 mg by mouth every 6 (six) hours as needed for moderate pain.     ondansetron 8 MG disintegrating tablet  Commonly known as:  ZOFRAN ODT  8mg  ODT q4 hours prn nausea     oxyCODONE 5 MG immediate release tablet  Commonly known as:  Oxy IR/ROXICODONE  Take 1-3 tablets (5-15 mg total) by mouth every 4 (four) hours as needed for moderate pain, severe pain or breakthrough pain.  Follow-up Information    Follow up with [facility] On 09/03/2014.   Specialty:  General Surgery   Why:  arrive by 1PM for a 1:30PM post op check   Contact information:   [address] [place] [address] [phone]       Signed: Ashok Norris,  [facility] [phone]  08/12/2014, 11:23 AM

## 2014-08-13 ENCOUNTER — Encounter (HOSPITAL_COMMUNITY): Admission: RE | Payer: Self-pay | Source: Ambulatory Visit

## 2014-08-13 ENCOUNTER — Ambulatory Visit (HOSPITAL_COMMUNITY): Admission: RE | Admit: 2014-08-13 | Payer: Medicaid Other | Source: Ambulatory Visit | Admitting: General Surgery

## 2014-08-13 SURGERY — LAPAROSCOPIC CHOLECYSTECTOMY WITH INTRAOPERATIVE CHOLANGIOGRAM
Anesthesia: General

## 2014-11-02 ENCOUNTER — Emergency Department (HOSPITAL_COMMUNITY)
Admission: EM | Admit: 2014-11-02 | Discharge: 2014-11-02 | Disposition: A | Discharge status: Home / Self Care | Payer: Medicaid Other | Attending: Emergency Medicine | Admitting: Emergency Medicine

## 2014-11-02 ENCOUNTER — Emergency Department (HOSPITAL_COMMUNITY): Payer: Medicaid Other

## 2014-11-02 DIAGNOSIS — R109 Unspecified abdominal pain: Secondary | ICD-10-CM | POA: Diagnosis not present

## 2014-11-02 DIAGNOSIS — Z720 Tobacco use: Secondary | ICD-10-CM | POA: Insufficient documentation

## 2014-11-02 DIAGNOSIS — Z8659 Personal history of other mental and behavioral disorders: Secondary | ICD-10-CM | POA: Diagnosis not present

## 2014-11-02 LAB — COMPREHENSIVE METABOLIC PANEL
ALT: 43 U/L (ref 17–63)
ANION GAP: 12 (ref 5–15)
AST: 45 U/L — AB (ref 15–41)
Albumin: 4.8 g/dL (ref 3.5–5.0)
Alkaline Phosphatase: 127 U/L — ABNORMAL HIGH (ref 38–126)
BILIRUBIN TOTAL: 1.6 mg/dL — AB (ref 0.3–1.2)
CALCIUM: 10 mg/dL (ref 8.9–10.3)
CO2: 20 mmol/L — ABNORMAL LOW (ref 22–32)
CREATININE: 1.04 mg/dL (ref 0.61–1.24)
Chloride: 108 mmol/L (ref 101–111)
GFR calc Af Amer: >60 mL/min (ref >60)
Glucose, Bld: 97 mg/dL (ref 65–99)
Potassium: 3.8 mmol/L (ref 3.5–5.1)
Sodium: 140 mmol/L (ref 135–145)
TOTAL PROTEIN: 7.7 g/dL (ref 6.5–8.1)

## 2014-11-02 LAB — CBC WITH DIFFERENTIAL/PLATELET
Basophils Absolute: 0 10*3/uL (ref 0.0–0.1)
Basophils Relative: 0 % (ref 0–1)
EOS PCT: 1 % (ref 0–5)
Eosinophils Absolute: 0.1 10*3/uL (ref 0.0–0.7)
HEMATOCRIT: 43.2 % (ref 39.0–52.0)
HEMOGLOBIN: 15.2 g/dL (ref 13.0–17.0)
LYMPHS ABS: 2.1 10*3/uL (ref 0.7–4.0)
Lymphocytes Relative: 25 % (ref 12–46)
MCH: 29.3 pg (ref 26.0–34.0)
MCHC: 35.2 g/dL (ref 30.0–36.0)
MCV: 83.4 fL (ref 78.0–100.0)
MONO ABS: 0.7 10*3/uL (ref 0.1–1.0)
MONOS PCT: 8 % (ref 3–12)
Neutro Abs: 5.5 10*3/uL (ref 1.7–7.7)
Neutrophils Relative %: 66 % (ref 43–77)
Platelets: 220 10*3/uL (ref 150–400)
RBC: 5.18 MIL/uL (ref 4.22–5.81)
RDW: 12.7 % (ref 11.5–15.5)
WBC: 8.3 10*3/uL (ref 4.0–10.5)

## 2014-11-02 LAB — URINALYSIS, ROUTINE W REFLEX MICROSCOPIC
BILIRUBIN URINE: NEGATIVE
Glucose, UA: NEGATIVE mg/dL
HGB URINE DIPSTICK: NEGATIVE
KETONES UR: 15 mg/dL — AB
Leukocytes, UA: NEGATIVE
Nitrite: NEGATIVE
Protein, ur: NEGATIVE mg/dL
SPECIFIC GRAVITY, URINE: 1.009 (ref 1.005–1.030)
UROBILINOGEN UA: 0.2 mg/dL (ref 0.0–1.0)
pH: 8 (ref 5.0–8.0)

## 2014-11-02 LAB — LIPASE, BLOOD: Lipase: 16 U/L — ABNORMAL LOW (ref 22–51)

## 2014-11-02 MED ORDER — LORAZEPAM 2 MG/ML IJ SOLN
2.0000 mg | Freq: Once | INTRAMUSCULAR | Status: AC
  Administered 2014-11-02: 2 mg via INTRAVENOUS
  Filled 2014-11-02: qty 1

## 2014-11-02 MED ORDER — HYDROMORPHONE HCL 1 MG/ML IJ SOLN
1.0000 mg | Freq: Once | INTRAMUSCULAR | Status: AC
  Administered 2014-11-02: 1 mg via INTRAVENOUS
  Filled 2014-11-02: qty 1

## 2014-11-02 MED ORDER — IBUPROFEN 600 MG PO TABS: 600.0000 mg | ORAL_TABLET | Freq: Three times a day (TID) | ORAL | Status: DC | PRN

## 2014-11-02 MED ORDER — DICYCLOMINE HCL 20 MG PO TABS: 20.0000 mg | ORAL_TABLET | Freq: Three times a day (TID) | ORAL | Status: DC | PRN

## 2014-11-02 MED ORDER — KETOROLAC TROMETHAMINE 30 MG/ML IJ SOLN
30.0000 mg | Freq: Once | INTRAMUSCULAR | Status: AC
  Administered 2014-11-02: 30 mg via INTRAVENOUS
  Filled 2014-11-02: qty 1

## 2014-11-02 NOTE — ED Notes (Signed)
EMS - Patient coming from home with c/o of RLQ pain onset today.  Patient states he was having dull aching last night that progressively got worse this am.  Gallbladder removed in [place].  Given 250mg  Fentanly and 4mg  Zofran with EMS.

## 2014-11-02 NOTE — ED Notes (Signed)
Pt transported to CT ?

## 2014-11-02 NOTE — ED Provider Notes (Signed)
CSN: [id number]     Arrival date & time 11/02/14  1326 History   First MD Initiated Contact with Patient 11/02/14 1327     Chief Complaint  Patient presents with  . Abdominal Pain      HPI Patient presents the emergency department severe right-sided abdominal pain with some radiation to the right flank which began today.  He was having a dull ache last night which progressively got worse.  He's had a cholecystectomy.  Several pain this severe before.  EMS gave 250 mg of fentanyl and 4 mg of Zofran in route.  Patient still having rather exquisite discomfort and pain this time.  No fevers or chills.  No hematemesis.  No melena or hematochezia.  No dysuria or urinary frequency.  His pain is moderate to severe in severity at this time.   Past Medical History  Diagnosis Date  . ADHD (attention deficit hyperactivity disorder)   . Bipolar 1 disorder   . Shortness of breath dyspnea     exersion, from smoking   Past Surgical History  Procedure Laterality Date  . Cholecystectomy N/A 08/10/2014    Procedure: LAPAROSCOPIC CHOLECYSTECTOMY WITH INTRAOPERATIVE CHOLANGIOGRAM;  Surgeon: Ovidio Kin, MD;  Location: [facility];  Service: General;  Laterality: N/A;   No family history on file. History  Substance Use Topics  . Smoking status: Current Every Day Smoker -- 0.50 packs/day for 3 years    Types: Cigarettes  . Smokeless tobacco: Not on file  . Alcohol Use: No    Review of Systems  All other systems reviewed and are negative.     Allergies  Review of patient's allergies indicates no known allergies.  Home Medications   Prior to Admission medications   Medication Sig Start Date End Date Taking? Authorizing Provider  acetaminophen (TYLENOL) 325 MG tablet Take 650 mg by mouth every 6 (six) hours as needed (pain).     Historical Provider, MD  ibuprofen (ADVIL,MOTRIN) 200 MG tablet Take 400 mg by mouth every 6 (six) hours as needed for moderate pain.     Historical Provider, MD   ondansetron (ZOFRAN ODT) 8 MG disintegrating tablet  ODT q4 hours prn nausea Patient not taking: Reported on 08/05/2014 07/08/14   Dahlia Client Muthersbaugh, PA-C  oxyCODONE (OXY IR/ROXICODONE) 5 MG immediate release tablet Take 1-3 tablets (5-15 mg total) by mouth every 4 (four) hours as needed for moderate pain, severe pain or breakthrough pain. 08/12/14   Emina Riebock, NP   BP 145/63 mmHg  Pulse 117  Temp(Src) 98 F (36.7 C) (Oral)  Resp 27  Ht  (1.854 m)  Wt 163 lb (73.936 kg)  BMI 21.51 kg/m2  SpO2 97% Physical Exam  Constitutional: He is oriented to person, place, and time. He appears well-developed and well-nourished.  HENT:  Head: Normocephalic and atraumatic.  Eyes: EOM are normal.  Neck: Normal range of motion.  Cardiovascular: Normal rate, regular rhythm, normal heart sounds and intact distal pulses.   Pulmonary/Chest: Effort normal and breath sounds normal. No respiratory distress.  Abdominal: Soft. He exhibits no distension.  Right-sided abdominal tenderness without guarding or rebound.  Musculoskeletal: Normal range of motion.  Neurological: He is alert and oriented to person, place, and time.  Skin: Skin is warm and dry.  Psychiatric: He has a normal mood and affect. Judgment normal.  Nursing note and vitals reviewed.   ED Course  Procedures (including critical care time) Labs Review Labs Reviewed  COMPREHENSIVE METABOLIC PANEL - Abnormal; Notable for the  following:    CO2 20 (*)    BUN <5 (*)    AST 45 (*)    Alkaline Phosphatase 127 (*)    Total Bilirubin 1.6 (*)    All other components within normal limits  LIPASE, BLOOD - Abnormal; Notable for the following:    Lipase 16 (*)    All other components within normal limits  URINALYSIS, ROUTINE W REFLEX MICROSCOPIC (NOT AT [facility]) - Abnormal; Notable for the following:    Ketones, ur 15 (*)    All other components within normal limits  CBC WITH DIFFERENTIAL/PLATELET    Imaging Review Ct Abdomen Pelvis  Wo Contrast  11/02/2014   CLINICAL DATA:  Diffuse abdominal pain that radiates to the right flank.  EXAM: CT ABDOMEN AND PELVIS WITHOUT CONTRAST  TECHNIQUE: Multidetector CT imaging of the abdomen and pelvis was performed following the standard protocol without IV contrast.  COMPARISON:  03/26/2010  FINDINGS: Stable mild scarring at the posterior left lung base. Otherwise, the lung bases are clear. Negative for free intraperitoneal air.  Gallbladder has been removed. Normal appearance of the liver, spleen, pancreas, kidneys and adrenal glands. No evidence for kidney stones or hydronephrosis. Normal appearance of the urinary bladder.  Normal appearance of the stomach, small bowel and large bowel. No acute abnormality to the appendix. No significant free fluid or lymphadenopathy.  No acute bone abnormality.  IMPRESSION: No acute abnormality in the abdomen or pelvis.   Electronically Signed   By: Richarda Overlie M.D.   On: 11/02/2014 14:12  I personally reviewed the imaging tests through PACS system I reviewed available ER/hospitalization records through the EMR    EKG Interpretation None      MDM   Final diagnoses:  None    4:22 PM Patient is feeling better at this time.  Discharge home in good condition.  Vital signs normalized.  Labs, urine, CT scan without significant abnormality.  Discharge home in good condition.  Home with anti-inflammatories and Bentyl.  Patient understands to return to the ER for new or worsening symptoms.    Azalia Bilis, MD 11/02/14 [phone]

## 2014-11-02 NOTE — ED Notes (Signed)
Family at bedside. 

## 2014-11-02 NOTE — ED Notes (Signed)
Family reporting that the pts pain has returned.  Although he was smiling and on the phone 10 minutes ago

## 2014-11-02 NOTE — ED Notes (Signed)
The pt is relaxed family at the bedside

## 2014-11-02 NOTE — Discharge Instructions (Signed)

## 2014-11-03 ENCOUNTER — Encounter (HOSPITAL_COMMUNITY): Payer: Self-pay | Admitting: Physical Medicine and Rehabilitation

## 2014-11-03 ENCOUNTER — Emergency Department (HOSPITAL_COMMUNITY)
Admission: EM | Admit: 2014-11-03 | Discharge: 2014-11-03 | Disposition: A | Discharge status: Home / Self Care | Payer: Medicaid Other | Attending: Emergency Medicine | Admitting: Emergency Medicine

## 2014-11-03 DIAGNOSIS — Y9389 Activity, other specified: Secondary | ICD-10-CM | POA: Insufficient documentation

## 2014-11-03 DIAGNOSIS — Z720 Tobacco use: Secondary | ICD-10-CM | POA: Diagnosis not present

## 2014-11-03 DIAGNOSIS — R1031 Right lower quadrant pain: Secondary | ICD-10-CM | POA: Diagnosis present

## 2014-11-03 DIAGNOSIS — Y998 Other external cause status: Secondary | ICD-10-CM | POA: Insufficient documentation

## 2014-11-03 DIAGNOSIS — S39011A Strain of muscle, fascia and tendon of abdomen, initial encounter: Secondary | ICD-10-CM | POA: Insufficient documentation

## 2014-11-03 DIAGNOSIS — R1110 Vomiting, unspecified: Secondary | ICD-10-CM | POA: Insufficient documentation

## 2014-11-03 DIAGNOSIS — Y929 Unspecified place or not applicable: Secondary | ICD-10-CM | POA: Diagnosis not present

## 2014-11-03 DIAGNOSIS — X58XXXA Exposure to other specified factors, initial encounter: Secondary | ICD-10-CM | POA: Insufficient documentation

## 2014-11-03 DIAGNOSIS — F419 Anxiety disorder, unspecified: Secondary | ICD-10-CM | POA: Insufficient documentation

## 2014-11-03 LAB — COMPREHENSIVE METABOLIC PANEL WITH GFR
ALT: 33 U/L (ref 17–63)
AST: 34 U/L (ref 15–41)
Albumin: 4.3 g/dL (ref 3.5–5.0)
Alkaline Phosphatase: 109 U/L (ref 38–126)
Anion gap: 8 (ref 5–15)
BUN: 6 mg/dL (ref 6–20)
CO2: 21 mmol/L — ABNORMAL LOW (ref 22–32)
Calcium: 9.3 mg/dL (ref 8.9–10.3)
Chloride: 112 mmol/L — ABNORMAL HIGH (ref 101–111)
Creatinine, Ser: 0.86 mg/dL (ref 0.61–1.24)
GFR calc Af Amer: >60 mL/min
GFR calc non Af Amer: >60 mL/min
Glucose, Bld: 121 mg/dL — ABNORMAL HIGH (ref 65–99)
Potassium: 3.6 mmol/L (ref 3.5–5.1)
Sodium: 141 mmol/L (ref 135–145)
Total Bilirubin: 1.1 mg/dL (ref 0.3–1.2)
Total Protein: 7.1 g/dL (ref 6.5–8.1)

## 2014-11-03 LAB — CBC WITH DIFFERENTIAL/PLATELET
BASOS ABS: 0 10*3/uL (ref 0.0–0.1)
Basophils Relative: 0 % (ref 0–1)
Eosinophils Absolute: 0.1 10*3/uL (ref 0.0–0.7)
Eosinophils Relative: 1 % (ref 0–5)
HCT: 39.2 % (ref 39.0–52.0)
Hemoglobin: 13.5 g/dL (ref 13.0–17.0)
Lymphocytes Relative: 26 % (ref 12–46)
Lymphs Abs: 1.7 10*3/uL (ref 0.7–4.0)
MCH: 29 pg (ref 26.0–34.0)
MCHC: 34.4 g/dL (ref 30.0–36.0)
MCV: 84.1 fL (ref 78.0–100.0)
MONOS PCT: 8 % (ref 3–12)
Monocytes Absolute: 0.5 10*3/uL (ref 0.1–1.0)
Neutro Abs: 4.2 10*3/uL (ref 1.7–7.7)
Neutrophils Relative %: 65 % (ref 43–77)
Platelets: 195 10*3/uL (ref 150–400)
RBC: 4.66 MIL/uL (ref 4.22–5.81)
RDW: 12.9 % (ref 11.5–15.5)
WBC: 6.6 10*3/uL (ref 4.0–10.5)

## 2014-11-03 LAB — I-STAT CG4 LACTIC ACID, ED: Lactic Acid, Venous: 1.5 mmol/L (ref 0.5–2.0)

## 2014-11-03 LAB — LIPASE, BLOOD: Lipase: 14 U/L — ABNORMAL LOW (ref 22–51)

## 2014-11-03 MED ORDER — METHOCARBAMOL 1000 MG/10ML IJ SOLN
1000.0000 mg | Freq: Once | INTRAMUSCULAR | Status: DC
  Filled 2014-11-03: qty 10

## 2014-11-03 MED ORDER — ORPHENADRINE CITRATE 30 MG/ML IJ SOLN: 60.0000 mg | Freq: Two times a day (BID) | INTRAMUSCULAR | Status: DC

## 2014-11-03 MED ORDER — HALOPERIDOL LACTATE 5 MG/ML IJ SOLN
5.0000 mg | Freq: Once | INTRAMUSCULAR | Status: AC
  Administered 2014-11-03: 5 mg via INTRAVENOUS
  Filled 2014-11-03: qty 1

## 2014-11-03 MED ORDER — HALOPERIDOL 2 MG PO TABS: 2.0000 mg | ORAL_TABLET | Freq: Two times a day (BID) | ORAL | Status: DC | PRN

## 2014-11-03 MED ORDER — KETOROLAC TROMETHAMINE 30 MG/ML IJ SOLN
30.0000 mg | Freq: Once | INTRAMUSCULAR | Status: AC
  Administered 2014-11-03: 30 mg via INTRAVENOUS
  Filled 2014-11-03: qty 1

## 2014-11-03 MED ORDER — SODIUM CHLORIDE 0.9 % IV BOLUS (SEPSIS)
1000.0000 mL | Freq: Once | INTRAVENOUS | Status: AC
  Administered 2014-11-03: 1000 mL via INTRAVENOUS

## 2014-11-03 NOTE — Discharge Instructions (Signed)

## 2014-11-03 NOTE — ED Notes (Addendum)
Pt presents to department for evaluation of RLQ abdominal pain. 10/10 pain upon arrival. Also states nausea. Was seen for same yesterday and discharged home. Pt is alert and oriented x4. Received of Fentanyl and 4mg  Zofran.

## 2014-11-03 NOTE — ED Provider Notes (Signed)
CSN: [id number]     Arrival date & time 11/03/14  1614 History   First MD Initiated Contact with Patient 11/03/14 1618     Chief Complaint  Patient presents with  . Abdominal Pain  . Nausea     (Consider location/radiation/quality/duration/timing/severity/associated sxs/prior Treatment) Patient is a 19 y.o. male presenting with abdominal pain. The history is provided by the patient and a parent.  Abdominal Pain Pain location:  RLQ Pain quality: aching   Pain radiation: right hip. Pain severity:  Moderate Onset quality:  Gradual Duration:  1 day Timing:  Constant Progression:  Unchanged Chronicity:  New Context: awakening from sleep and previous surgery (s/p chole)   Context: not trauma   Relieved by:  Nothing Worsened by:  Nothing tried Ineffective treatments:  NSAIDs (bentyl) Associated symptoms: fever and vomiting (4-5x this morning)   Associated symptoms: no cough, no diarrhea, no dysuria, no hematuria and no shortness of breath   Risk factors: no alcohol abuse   Risk factors comment:  Smokes 1ppd, occasional marijuana   Past Medical History  Diagnosis Date  . ADHD (attention deficit hyperactivity disorder)   . Bipolar 1 disorder   . Shortness of breath dyspnea     exersion, from smoking   Past Surgical History  Procedure Laterality Date  . Cholecystectomy N/A 08/10/2014    Procedure: LAPAROSCOPIC CHOLECYSTECTOMY WITH INTRAOPERATIVE CHOLANGIOGRAM;  Surgeon: Ovidio Kin, MD;  Location: [facility];  Service: General;  Laterality: N/A;   History reviewed. No pertinent family history. History  Substance Use Topics  . Smoking status: Current Every Day Smoker -- 0.50 packs/day for 3 years    Types: Cigarettes  . Smokeless tobacco: Not on file  . Alcohol Use: No    Review of Systems  Constitutional: Positive for fever.  Respiratory: Negative for cough and shortness of breath.   Gastrointestinal: Positive for vomiting (4-5x this morning) and abdominal pain. Negative  for diarrhea.  Genitourinary: Negative for dysuria and hematuria.  All other systems reviewed and are negative.     Allergies  Review of patient's allergies indicates no known allergies.  Home Medications   Prior to Admission medications   Medication Sig Start Date End Date Taking? Authorizing Provider  acetaminophen (TYLENOL) 325 MG tablet Take 650 mg by mouth every 6 (six) hours as needed (pain).    Yes Historical Provider, MD  dicyclomine (BENTYL) 20 MG tablet Take 1 tablet (20 mg total) by mouth every 8 (eight) hours as needed for spasms (abdominal pain). 11/02/14  Yes Azalia Bilis, MD  ibuprofen (ADVIL,MOTRIN) 600 MG tablet Take 1 tablet (600 mg total) by mouth every 8 (eight) hours as needed. Patient taking differently: Take 600 mg by mouth every 8 (eight) hours as needed (pain).  11/02/14  Yes Azalia Bilis, MD  haloperidol (HALDOL) 2 MG tablet Take 1 tablet (2 mg total) by mouth 2 (two) times daily as needed (anxiety). 11/03/14   Lyndal Pulley, MD   BP 114/70 mmHg  Pulse 55  Temp(Src) 98.6 F (37 C) (Oral)  Resp 18  SpO2 96% Physical Exam  Constitutional: He is oriented to person, place, and time. He appears well-developed and well-nourished. No distress.  HENT:  Head: Normocephalic and atraumatic.  Eyes: Conjunctivae are normal.  Neck: Neck supple. No tracheal deviation present.  Cardiovascular: Normal rate and regular rhythm.   Pulmonary/Chest: Effort normal. No respiratory distress.  Abdominal: Soft. He exhibits no distension. There is tenderness (exquisite RLQ). Hernia confirmed negative in the right inguinal area and  confirmed negative in the left inguinal area.  Genitourinary: Testes normal and penis normal. Right testis shows no swelling and no tenderness. Left testis shows no swelling and no tenderness.  Neurological: He is alert and oriented to person, place, and time.  Skin: Skin is warm and dry.  Psychiatric: His mood appears anxious. He is agitated. Cognition and  memory are normal. He is inattentive.    ED Course  Procedures (including critical care time) Labs Review Labs Reviewed  COMPREHENSIVE METABOLIC PANEL - Abnormal; Notable for the following:    Chloride 112 (*)    CO2 21 (*)    Glucose, Bld 121 (*)    All other components within normal limits  LIPASE, BLOOD - Abnormal; Notable for the following:    Lipase 14 (*)    All other components within normal limits  CBC WITH DIFFERENTIAL/PLATELET  I-STAT CG4 LACTIC ACID, ED    Imaging Review Ct Abdomen Pelvis Wo Contrast  11/02/2014   CLINICAL DATA:  Diffuse abdominal pain that radiates to the right flank.  EXAM: CT ABDOMEN AND PELVIS WITHOUT CONTRAST  TECHNIQUE: Multidetector CT imaging of the abdomen and pelvis was performed following the standard protocol without IV contrast.  COMPARISON:  03/26/2010  FINDINGS: Stable mild scarring at the posterior left lung base. Otherwise, the lung bases are clear. Negative for free intraperitoneal air.  Gallbladder has been removed. Normal appearance of the liver, spleen, pancreas, kidneys and adrenal glands. No evidence for kidney stones or hydronephrosis. Normal appearance of the urinary bladder.  Normal appearance of the stomach, small bowel and large bowel. No acute abnormality to the appendix. No significant free fluid or lymphadenopathy.  No acute bone abnormality.  IMPRESSION: No acute abnormality in the abdomen or pelvis.   Electronically Signed   By: Richarda Overlie M.D.   On: 11/02/2014 14:12     EKG Interpretation None      MDM   Final diagnoses:  Right lower quadrant abdominal pain  Abdominal muscle strain, initial encounter    19 year old male presents with right lower quadrant pain that has been ongoing for the last 48 hours basically nonstop. He was seen yesterday for the same, had no significant vital sign or laboratory abnormalities, received a noncontrast CT to look for stone which was negative for any acute pathology in the abdomen or  pelvis including abnormalities of the appendix.  He is repeatedly flexing his abdominal muscles and voluntarily guarding but when engaged in conversation his pain is completely distractible. He is afebrile, hemodynamically stable on arrival despite tachycardia that at this time I believe is related to his extreme level of anxiety. He is asking if his gallbladder stones from his prior cholecystectomy could've lodged in his appendix and is moving around the bed agitated manner.  Plan will be to re-screening laboratory values, administer medications to help with severe anxiety and what appears to be abdominal muscle fatigue and spasm, reassess after the patient is allowed to rest and consider repeat imaging is indicated.  Following Haldol administration the patient's pain is completely resolved, he is ambulatory, he is feeling much better. There is no acute laboratory or vital sign abnormality that would indicate an intra-abdominal pathology. At this time the patient appears to be extremely anxious intermittently regarding an abdominal wall sharp pain in the lower portion of his abdomen. He was provided with a low dose of Haldol for breakthrough anxiety and panic related to this discomfort at home. Plan to follow up with PCP as needed  and return precautions discussed for worsening or new concerning symptoms.   Lyndal Pulley, MD 11/04/14 0981  Benjiman Core, MD 11/05/14 [phone]

## 2014-12-17 ENCOUNTER — Emergency Department (HOSPITAL_COMMUNITY)
Admission: EM | Admit: 2014-12-17 | Discharge: 2014-12-17 | Disposition: A | Discharge status: Home / Self Care | Payer: Medicaid Other | Attending: Emergency Medicine | Admitting: Emergency Medicine

## 2014-12-17 ENCOUNTER — Encounter (HOSPITAL_COMMUNITY): Payer: Self-pay | Admitting: Nurse Practitioner

## 2014-12-17 DIAGNOSIS — Z9049 Acquired absence of other specified parts of digestive tract: Secondary | ICD-10-CM | POA: Insufficient documentation

## 2014-12-17 DIAGNOSIS — Z720 Tobacco use: Secondary | ICD-10-CM | POA: Diagnosis not present

## 2014-12-17 DIAGNOSIS — R1031 Right lower quadrant pain: Secondary | ICD-10-CM | POA: Insufficient documentation

## 2014-12-17 DIAGNOSIS — R197 Diarrhea, unspecified: Secondary | ICD-10-CM | POA: Diagnosis not present

## 2014-12-17 DIAGNOSIS — F419 Anxiety disorder, unspecified: Secondary | ICD-10-CM | POA: Insufficient documentation

## 2014-12-17 LAB — CBC WITH DIFFERENTIAL/PLATELET
BASOS PCT: 0 % (ref 0–1)
Basophils Absolute: 0 10*3/uL (ref 0.0–0.1)
Eosinophils Absolute: 0.1 10*3/uL (ref 0.0–0.7)
Eosinophils Relative: 1 % (ref 0–5)
HCT: 43.7 % (ref 39.0–52.0)
Hemoglobin: 15.4 g/dL (ref 13.0–17.0)
LYMPHS ABS: 3.6 10*3/uL (ref 0.7–4.0)
Lymphocytes Relative: 40 % (ref 12–46)
MCH: 29.7 pg (ref 26.0–34.0)
MCHC: 35.2 g/dL (ref 30.0–36.0)
MCV: 84.2 fL (ref 78.0–100.0)
Monocytes Absolute: 0.9 10*3/uL (ref 0.1–1.0)
Monocytes Relative: 10 % (ref 3–12)
NEUTROS ABS: 4.4 10*3/uL (ref 1.7–7.7)
Neutrophils Relative %: 49 % (ref 43–77)
Platelets: 227 10*3/uL (ref 150–400)
RBC: 5.19 MIL/uL (ref 4.22–5.81)
RDW: 12.7 % (ref 11.5–15.5)
WBC: 9.1 10*3/uL (ref 4.0–10.5)

## 2014-12-17 LAB — URINE MICROSCOPIC-ADD ON

## 2014-12-17 LAB — BASIC METABOLIC PANEL
ANION GAP: 10 (ref 5–15)
BUN: 11 mg/dL (ref 6–20)
CHLORIDE: 110 mmol/L (ref 101–111)
CO2: 21 mmol/L — AB (ref 22–32)
CREATININE: 0.97 mg/dL (ref 0.61–1.24)
Calcium: 9.7 mg/dL (ref 8.9–10.3)
GFR calc Af Amer: >60 mL/min (ref >60)
Glucose, Bld: 97 mg/dL (ref 65–99)
Potassium: 3.4 mmol/L — ABNORMAL LOW (ref 3.5–5.1)
Sodium: 141 mmol/L (ref 135–145)

## 2014-12-17 LAB — URINALYSIS, ROUTINE W REFLEX MICROSCOPIC
Bilirubin Urine: NEGATIVE
GLUCOSE, UA: NEGATIVE mg/dL
HGB URINE DIPSTICK: NEGATIVE
KETONES UR: NEGATIVE mg/dL
LEUKOCYTES UA: NEGATIVE
NITRITE: NEGATIVE
PH: 8 (ref 5.0–8.0)
Protein, ur: NEGATIVE mg/dL
Specific Gravity, Urine: 1.02 (ref 1.005–1.030)
UROBILINOGEN UA: 1 mg/dL (ref 0.0–1.0)

## 2014-12-17 MED ORDER — ONDANSETRON HCL 4 MG/2ML IJ SOLN
4.0000 mg | Freq: Once | INTRAMUSCULAR | Status: AC
  Administered 2014-12-17: 4 mg via INTRAVENOUS
  Filled 2014-12-17: qty 2

## 2014-12-17 MED ORDER — MORPHINE SULFATE 4 MG/ML IJ SOLN
4.0000 mg | Freq: Once | INTRAMUSCULAR | Status: AC
  Administered 2014-12-17: 4 mg via INTRAVENOUS
  Filled 2014-12-17: qty 1

## 2014-12-17 MED ORDER — SODIUM CHLORIDE 0.9 % IV BOLUS (SEPSIS)
1000.0000 mL | Freq: Once | INTRAVENOUS | Status: AC
  Administered 2014-12-17: 1000 mL via INTRAVENOUS

## 2014-12-17 MED ORDER — DICYCLOMINE HCL 10 MG/ML IM SOLN
20.0000 mg | Freq: Once | INTRAMUSCULAR | Status: AC
  Administered 2014-12-17: 20 mg via INTRAMUSCULAR
  Filled 2014-12-17: qty 2

## 2014-12-17 MED ORDER — HYOSCYAMINE SULFATE 0.125 MG SL SUBL: 0.1250 mg | SUBLINGUAL_TABLET | SUBLINGUAL | Status: DC | PRN

## 2014-12-17 MED ORDER — OXYCODONE-ACETAMINOPHEN 5-325 MG PO TABS
1.0000 | ORAL_TABLET | Freq: Once | ORAL | Status: AC
  Administered 2014-12-17: 1 via ORAL
  Filled 2014-12-17: qty 1

## 2014-12-17 NOTE — ED Notes (Signed)
Bed: WA03 Expected date:  Expected time:  Means of arrival:  Comments: EMS 18yo abd pain / syncopal episode

## 2014-12-17 NOTE — ED Provider Notes (Signed)
CSN: [id number]     Arrival date & time 12/17/14  0136 History  This chart was scribed for Gilda Crease, MD by Octavia Heir, ED Scribe. This patient was seen in room WA03/WA03 and the patient's care was started at 1:50 AM.    Chief Complaint  Patient presents with  . Abdominal Pain      The history is provided by the patient. No language interpreter was used.   HPI Comments: Johnny Campos is a 19 y.o. male who presents to the Emergency Department complaining of intermittent, sudden onset, gradual worsening, sharp RLQ pain onset this evening. He notes having associated dry heaving and diarrhea that started today. Pt reports being seen in the ED twice before for the same symptoms but states "nothing has been done about it". Pt notes taking some OTC medication for his diarrhea earlier this evening. He denies vomiting.  Past Medical History  Diagnosis Date  . ADHD (attention deficit hyperactivity disorder)   . Bipolar 1 disorder   . Shortness of breath dyspnea     exersion, from smoking   Past Surgical History  Procedure Laterality Date  . Cholecystectomy N/A 08/10/2014    Procedure: LAPAROSCOPIC CHOLECYSTECTOMY WITH INTRAOPERATIVE CHOLANGIOGRAM;  Surgeon: Ovidio Kin, MD;  Location: [facility];  Service: General;  Laterality: N/A;   History reviewed. No pertinent family history. History  Substance Use Topics  . Smoking status: Current Every Day Smoker -- 0.50 packs/day for 3 years    Types: Cigarettes  . Smokeless tobacco: Not on file  . Alcohol Use: No    Review of Systems  Gastrointestinal: Positive for abdominal pain and diarrhea. Negative for vomiting.  Psychiatric/Behavioral: The patient is nervous/anxious.   All other systems reviewed and are negative.     Allergies  Review of patient's allergies indicates no known allergies.  Home Medications   Prior to Admission medications   Medication Sig Start Date End Date Taking? Authorizing Provider   acetaminophen (TYLENOL) 325 MG tablet Take 650 mg by mouth every 6 (six) hours as needed (pain).     Historical Provider, MD  hyoscyamine (LEVSIN/SL) 0.125 MG SL tablet Place 1 tablet (0.125 mg total) under the tongue every 4 (four) hours as needed. 12/17/14   Gilda Crease, MD   Triage vitals: BP 120/69 mmHg  Pulse 122  Temp(Src) 97.8 F (36.6 C) (Oral)  Resp 30  SpO2 100% Physical Exam  Constitutional: He is oriented to person, place, and time. He appears well-developed and well-nourished. No distress.  HENT:  Head: Normocephalic and atraumatic.  Right Ear: Hearing normal.  Left Ear: Hearing normal.  Nose: Nose normal.  Mouth/Throat: Oropharynx is clear and moist and mucous membranes are normal.  Eyes: Conjunctivae and EOM are normal. Pupils are equal, round, and reactive to light.  Neck: Normal range of motion. Neck supple.  Cardiovascular: Regular rhythm, S1 normal and S2 normal.  Exam reveals no gallop and no friction rub.   No murmur heard. Pulmonary/Chest: Effort normal and breath sounds normal. No respiratory distress. He exhibits no tenderness.  Abdominal: Soft. Normal appearance and bowel sounds are normal. There is no hepatosplenomegaly. There is tenderness. There is no rebound, no guarding, no tenderness at McBurney's point and negative Murphy's sign. No hernia.  RLQ tenderness  Musculoskeletal: Normal range of motion.  Neurological: He is alert and oriented to person, place, and time. He has normal strength. No cranial nerve deficit or sensory deficit. Coordination normal. GCS eye subscore is 4. GCS verbal  subscore is 5. GCS motor subscore is 6.  Skin: Skin is warm, dry and intact. No rash noted. No cyanosis.  Psychiatric: His speech is normal and behavior is normal. Thought content normal. His mood appears anxious.  Nursing note and vitals reviewed.   ED Course  Procedures  DIAGNOSTIC STUDIES: Oxygen Saturation is 100% on RA, normal by my  interpretation.  COORDINATION OF CARE:  1:53 AM-Discussed treatment plan which includes pain medication with pt at bedside and they agreed to plan.   Labs Review Labs Reviewed  BASIC METABOLIC PANEL - Abnormal; Notable for the following:    Potassium 3.4 (*)    CO2 21 (*)    All other components within normal limits  URINALYSIS, ROUTINE W REFLEX MICROSCOPIC (NOT AT [facility]) - Abnormal; Notable for the following:    APPearance TURBID (*)    All other components within normal limits  URINE MICROSCOPIC-ADD ON - Abnormal; Notable for the following:    Bacteria, UA FEW (*)    All other components within normal limits  CBC WITH DIFFERENTIAL/PLATELET    Imaging Review No results found.   EKG Interpretation None      MDM   Final diagnoses:  Abdominal pain, RLQ    Patient presents to the emergency department for evaluation of right lower abdominal pain. Patient has been seen for this previously. Patient reports onset of sharp, stabbing pains in the right lower abdomen. Reviewing his records reveals a presentation 2 months ago with identical symptoms. At that time his workup was entirely normal including CAT scan.  Patient reveals tenderness in the right lower quadrant without guarding or rebound currently. Patient is afebrile. Patient was tachycardic and tachypneic at arrival, was hyperventilating. Symptoms improved with IV morphine. Patient's blood counts are normal, no leukocytosis. Patient reports that he has had daily pain for the last month, but pain significantly worsened tonight. This history, presentation and workup is not consistent with appendicitis. Patient does not require repeat imaging. He'll be treated symptomatically and is to follow-up with gastroenterology.  I personally performed the services described in this documentation, which was scribed in my presence. The recorded information has been reviewed and is accurate.   Gilda Crease, MD 12/17/14 [phone]

## 2014-12-17 NOTE — ED Notes (Signed)
Pt is presented by medics from home, c/o sudden onset of a sharp RLQ pain, states he has problems with gallbladder for the last month with no resolve. Currently hyperventilating and in a fetal position rating pain 10/10. Received 4 mg IM by medics en route.

## 2014-12-24 ENCOUNTER — Encounter: Payer: Self-pay | Admitting: Internal Medicine

## 2015-01-15 ENCOUNTER — Encounter: Payer: Self-pay | Admitting: Gastroenterology

## 2015-01-15 ENCOUNTER — Other Ambulatory Visit: Payer: Self-pay

## 2015-01-15 ENCOUNTER — Ambulatory Visit (INDEPENDENT_AMBULATORY_CARE_PROVIDER_SITE_OTHER): Payer: Medicaid Other | Admitting: Gastroenterology

## 2015-01-15 VITALS — BP 118/66 | HR 75 | Temp 98.5°F | Ht 73.0 in | Wt 155.0 lb

## 2015-01-15 DIAGNOSIS — K5900 Constipation, unspecified: Secondary | ICD-10-CM

## 2015-01-15 DIAGNOSIS — R197 Diarrhea, unspecified: Secondary | ICD-10-CM | POA: Diagnosis not present

## 2015-01-15 DIAGNOSIS — R1031 Right lower quadrant pain: Secondary | ICD-10-CM

## 2015-01-15 DIAGNOSIS — G8929 Other chronic pain: Secondary | ICD-10-CM | POA: Insufficient documentation

## 2015-01-15 MED ORDER — DICYCLOMINE HCL 10 MG PO CAPS: ORAL_CAPSULE | ORAL | Status: AC

## 2015-01-15 MED ORDER — PEG-KCL-NACL-NASULF-NA ASC-C 100 G PO SOLR: 1.0000 | ORAL | Status: AC

## 2015-01-15 MED ORDER — POLYETHYLENE GLYCOL 3350 17 GM/SCOOP PO POWD: ORAL | Status: AC

## 2015-01-15 NOTE — Progress Notes (Addendum)
Primary Care Physician:  House, Karen A, FNP  Primary Gastroenterologist:  Michael Rourk, MD   Chief Complaint  Patient presents with  . Abdominal Pain  . Constipation  . Diarrhea  . Nausea  . Emesis    HPI:  Johnny Campos is a 19 y.o. male here for further evaluation of RLQ pain, alternating constipation/diarrhea, N/V. Significant GI issues at least for past one year. Initially with biliary colic, cholelithiasis for several months. Admission 07/2014 with cholecystitis, cholelithiasis s/p lap chole with intraoperative cholangiogram 08/10/14. Seen in ED 12/17/14, 11/03/14, 11/02/14.  C/o new symptoms for several months. First ER visit in June, EMS called for severe abdominal pain. Pain is in RLQ. Pain is intermittent. Happens with and without food. Usually wakes up with no pain but within couple of hours pain starts. Appetite is good. No heartburn. No nocturnal symptoms. Quit greasy foods/seeds. If pain gets severe, then develops vomiting.  Weight is stable. No melena, brbpr. BM goes from constipation to diarrhea. About 50% of each. Can have 5-6 stools per day (loose) or go week without BM. Recently started Levsin, helps a little with the pain. Denies NSAIDS/ASA.  CT A/P without CM 11/02/14 S/p cholecystectomy but otherwise unremarkable.     Current Outpatient Prescriptions  Medication Sig Dispense Refill  . acetaminophen (TYLENOL) 325 MG tablet Take 650 mg by mouth every 6 (six) hours as needed (pain).     . hyoscyamine (LEVSIN/SL) 0.125 MG SL tablet Place 1 tablet (0.125 mg total) under the tongue every 4 (four) hours as needed. 15 tablet 0  .       .        No current facility-administered medications for this visit.    Allergies as of 01/15/2015 - Review Complete 01/15/2015  Allergen Reaction Noted  . Latex Rash 01/15/2015    Past Medical History  Diagnosis Date  . ADHD (attention deficit hyperactivity disorder)   . Bipolar 1 disorder   . Shortness of breath dyspnea    exersion, from smoking    Past Surgical History  Procedure Laterality Date  . Cholecystectomy N/A 08/10/2014    Procedure: LAPAROSCOPIC CHOLECYSTECTOMY WITH INTRAOPERATIVE CHOLANGIOGRAM;  Surgeon: David Newman, MD;  Location: [facility];  Service: General;  Laterality: N/A;    Family History  Problem Relation Age of Onset  . Crohn's disease Father   . Crohn's disease Paternal Aunt   . Crohn's disease Paternal Aunt   . Colon cancer Neg Hx     Social History   Social History  . Marital Status: Single    Spouse Name: N/A  . Number of Children: N/A  . Years of Education: N/A   Occupational History  . [employer]    Social History Main Topics  . Smoking status: Current Every Day Smoker -- 0.50 packs/day for 3 years    Types: Cigarettes  . Smokeless tobacco: Not on file  . Alcohol Use: No  . Drug Use: No  . Sexual Activity: Not on file   Other Topics Concern  . Not on file   Social History Narrative   Estranged from parents. He lives with his brother and a family friend.       ROS:  General: Negative for anorexia, weight loss, fever, chills, fatigue, weakness. Eyes: Negative for vision changes.  ENT: Negative for hoarseness, difficulty swallowing , nasal congestion. CV: Negative for chest pain, angina, palpitations, dyspnea on exertion, peripheral edema.  Respiratory: Negative for dyspnea at rest, dyspnea on exertion, cough, sputum,   wheezing.  GI: See history of present illness. GU:  Negative for dysuria, hematuria, urinary incontinence, urinary frequency, nocturnal urination.  MS: Negative for joint pain, low back pain.  Derm: Negative for rash or itching.  Neuro: Negative for weakness, abnormal sensation, seizure, frequent headaches, memory loss, confusion.  Psych: Negative for anxiety, depression, suicidal ideation, hallucinations.  Endo: Negative for unusual weight change.  Heme: Negative for bruising or bleeding. Allergy: Negative for rash or hives.    Physical  Examination:  BP 118/66 mmHg  Pulse 75  Temp(Src) 98.5 F (36.9 C)  Ht  (1.854 m)  Wt 155 lb (70.308 kg)  BMI 20.45 kg/m2   General: Well-nourished, well-developed in no acute distress. Accompanied by friend. Patient is anxious. Significant anxiety noted with physical examination.  Head: Normocephalic, atraumatic.   Eyes: Conjunctiva pink, no icterus. Mouth: Oropharyngeal mucosa moist and pink , no lesions erythema or exudate. Neck: Supple without thyromegaly, masses, or lymphadenopathy.  Lungs: Clear to auscultation bilaterally.  Heart: Regular rate and rhythm, no murmurs rubs or gallops.  Abdomen: Bowel sounds are normal, mild RLQ tenderness, nondistended, no hepatosplenomegaly or masses, no abdominal bruits or    hernia , no rebound or guarding.   Rectal: no performed Extremities: No lower extremity edema. No clubbing or deformities.  Neuro: Alert and oriented x 4 , grossly normal neurologically.  Skin: Warm and dry, no rash or jaundice.   Psych: Alert and cooperative, normal mood and affect.  Labs: Lab Results  Component Value Date   WBC 9.1 12/17/2014   HGB 15.4 12/17/2014   HCT 43.7 12/17/2014   MCV 84.2 12/17/2014   PLT 227 12/17/2014   Lab Results  Component Value Date   CREATININE 0.97 12/17/2014   BUN 11 12/17/2014   NA 141 12/17/2014   K 3.4* 12/17/2014   CL 110 12/17/2014   CO2 21* 12/17/2014   Lab Results  Component Value Date   ALT 33 11/03/2014   AST 34 11/03/2014   ALKPHOS 109 11/03/2014   BILITOT 1.1 11/03/2014   Lab Results  Component Value Date   LIPASE 14* 11/03/2014     Imaging Studies:   CLINICAL DATA: Diffuse abdominal pain that radiates to the right flank.  EXAM: CT ABDOMEN AND PELVIS WITHOUT CONTRAST  TECHNIQUE: Multidetector CT imaging of the abdomen and pelvis was performed following the standard protocol without IV contrast.  COMPARISON: 03/26/2010  FINDINGS: Stable mild scarring at the posterior left lung  base. Otherwise, the lung bases are clear. Negative for free intraperitoneal air.  Gallbladder has been removed. Normal appearance of the liver, spleen, pancreas, kidneys and adrenal glands. No evidence for kidney stones or hydronephrosis. Normal appearance of the urinary bladder.  Normal appearance of the stomach, small bowel and large bowel. No acute abnormality to the appendix. No significant free fluid or lymphadenopathy.  No acute bone abnormality.  IMPRESSION: No acute abnormality in the abdomen or pelvis.   Electronically Signed  By: Richarda Overlie M.D.  On: 11/02/2014 14:12

## 2015-01-15 NOTE — Patient Instructions (Addendum)
1. Colonoscopy with Dr. Jena Gauss. See separate instructions. Start Miralax to make sure you are having regular bowel movements. If you are constipated the week of your procedure you need to call the office. This is very important to make sure you have a good bowel prep for your test.  2. Trial of Bentyl one capsule up to four times daily for pain. Do not use with Levsin. Hold for constipation. RX sent to pharmacy.  3. Take Miralax, one capful at bedtime on days you do not have a bowel movement. RX sent to pharmacy.

## 2015-01-20 ENCOUNTER — Encounter: Payer: Self-pay | Admitting: Gastroenterology

## 2015-01-20 NOTE — Assessment & Plan Note (Addendum)
19 y/o male with several month history of RLQ pain associated with intermittent vomiting, alternating constipation/diarrhea, FH of IBD. Patient had cholecystectomy in 07/2014 with resolution of symptoms that led to surgery. Ddx includes IBD, IBS, less like chronic appendicitis. Recommend colonoscopy with ileoscopy in near future with deep sedation due to significant anxiety. I have discussed the risks, alternatives, benefits with regards to but not limited to the risk of reaction to medication, bleeding, infection, perforation and the patient is agreeable to proceed. Written consent to be obtained.  Trial of bentyl instead of Levsin for abdominal pain. Use of Miralax as needed for constipation. Importance of management of constipation if present week of colonoscopy discussed at length with patient to ensure adequate bowel prep.

## 2015-01-21 NOTE — Progress Notes (Signed)
cc'ed to pcp °

## 2015-02-03 ENCOUNTER — Encounter (HOSPITAL_COMMUNITY): Payer: Self-pay

## 2015-02-03 ENCOUNTER — Encounter (HOSPITAL_COMMUNITY)
Admission: RE | Admit: 2015-02-03 | Discharge: 2015-02-03 | Disposition: A | Discharge status: Home / Self Care | Payer: Medicaid Other | Source: Ambulatory Visit | Attending: Internal Medicine | Admitting: Internal Medicine

## 2015-02-03 DIAGNOSIS — Z01818 Encounter for other preprocedural examination: Secondary | ICD-10-CM | POA: Diagnosis present

## 2015-02-03 DIAGNOSIS — R109 Unspecified abdominal pain: Secondary | ICD-10-CM | POA: Diagnosis not present

## 2015-02-03 DIAGNOSIS — R197 Diarrhea, unspecified: Secondary | ICD-10-CM | POA: Diagnosis not present

## 2015-02-03 DIAGNOSIS — K5900 Constipation, unspecified: Secondary | ICD-10-CM | POA: Diagnosis not present

## 2015-02-03 LAB — BASIC METABOLIC PANEL
Anion gap: 5 (ref 5–15)
BUN: 9 mg/dL (ref 6–20)
CALCIUM: 9.1 mg/dL (ref 8.9–10.3)
CHLORIDE: 104 mmol/L (ref 101–111)
CO2: 28 mmol/L (ref 22–32)
CREATININE: 0.73 mg/dL (ref 0.61–1.24)
Glucose, Bld: 102 mg/dL — ABNORMAL HIGH (ref 65–99)
Potassium: 4.5 mmol/L (ref 3.5–5.1)
SODIUM: 137 mmol/L (ref 135–145)

## 2015-02-03 LAB — CBC
HCT: 44.7 % (ref 39.0–52.0)
HEMOGLOBIN: 15.1 g/dL (ref 13.0–17.0)
MCH: 29.5 pg (ref 26.0–34.0)
MCHC: 33.8 g/dL (ref 30.0–36.0)
MCV: 87.5 fL (ref 78.0–100.0)
PLATELETS: 220 10*3/uL (ref 150–400)
RBC: 5.11 MIL/uL (ref 4.22–5.81)
RDW: 13.3 % (ref 11.5–15.5)
WBC: 8 10*3/uL (ref 4.0–10.5)

## 2015-02-03 NOTE — Patient Instructions (Signed)
CONSTANCE HACKENBERG  02/03/2015     @   Your procedure is scheduled on 02/06/2015.  Report to [facility] at 6:30 [facility].M.  Call this number if you have problems the morning of surgery:  [phone]   Remember:  Do not eat food or drink liquids after midnight.  Take these medicines the morning of surgery with A SIP OF WATER Bentyl   Do not wear jewelry, make-up or nail polish.  Do not wear lotions, powders, or perfumes.  You may wear deodorant.  Do not shave 48 hours prior to surgery.  Men may shave face and neck.  Do not bring valuables to the hospital.  [facility] is not responsible for any belongings or valuables.  Contacts, dentures or bridgework may not be worn into surgery.  Leave your suitcase in the car.  After surgery it may be brought to your room.  For patients admitted to the hospital, discharge time will be determined by your treatment team.  Patients discharged the day of surgery will not be allowed to drive home.    Please read over the following fact sheets that you were given. Anesthesia Post-op Instructions     PATIENT INSTRUCTIONS POST-ANESTHESIA  IMMEDIATELY FOLLOWING SURGERY:  Do not drive or operate machinery for the first twenty four hours after surgery.  Do not make any important decisions for twenty four hours after surgery or while taking narcotic pain medications or sedatives.  If you develop intractable nausea and vomiting or a severe headache please notify your doctor immediately.  FOLLOW-UP:  Please make an appointment with your surgeon as instructed. You do not need to follow up with anesthesia unless specifically instructed to do so.  WOUND CARE INSTRUCTIONS (if applicable):  Keep a dry clean dressing on the anesthesia/puncture wound site if there is drainage.  Once the wound has quit draining you may leave it open to air.  Generally you should leave the bandage intact for twenty four hours unless there is drainage.  If the  epidural site drains for more than 36-48 hours please call the anesthesia department.  QUESTIONS?:  Please feel free to call your physician or the hospital operator if you have any questions, and they will be happy to assist you.      Colon Polyps Polyps are lumps of extra tissue growing inside the body. Polyps can grow in the large intestine (colon). Most colon polyps are noncancerous (benign). However, some colon polyps can become cancerous over time. Polyps that are larger than a pea may be harmful. To be safe, caregivers remove and test all polyps. CAUSES  Polyps form when mutations in the genes cause your cells to grow and divide even though no more tissue is needed. RISK FACTORS There are a number of risk factors that can increase your chances of getting colon polyps. They include:  Being older than 50 years.  Family history of colon polyps or colon cancer.  Long-term colon diseases, such as colitis or Crohn disease.  Being overweight.  Smoking.  Being inactive.  Drinking too much alcohol. SYMPTOMS  Most small polyps do not cause symptoms. If symptoms are present, they may include:  Blood in the stool. The stool may look dark red or black.  Constipation or diarrhea that lasts longer than 1 week. DIAGNOSIS People often do not know they have polyps until their caregiver finds them during a regular checkup. Your caregiver can use 4 tests to check for polyps:  Digital rectal exam. The  caregiver wears gloves and feels inside the rectum. This test would find polyps only in the rectum.  Barium enema. The caregiver puts a liquid called barium into your rectum before taking X-rays of your colon. Barium makes your colon look white. Polyps are dark, so they are easy to see in the X-ray pictures.  Sigmoidoscopy. A thin, flexible tube (sigmoidoscope) is placed into your rectum. The sigmoidoscope has a light and tiny camera in it. The caregiver uses the sigmoidoscope to look at the last  third of your colon.  Colonoscopy. This test is like sigmoidoscopy, but the caregiver looks at the entire colon. This is the most common method for finding and removing polyps. TREATMENT  Any polyps will be removed during a sigmoidoscopy or colonoscopy. The polyps are then tested for cancer. PREVENTION  To help lower your risk of getting more colon polyps:  Eat plenty of fruits and vegetables. Avoid eating fatty foods.  Do not smoke.  Avoid drinking alcohol.  Exercise every day.  Lose weight if recommended by your caregiver.  Eat plenty of calcium and folate. Foods that are rich in calcium include milk, cheese, and broccoli. Foods that are rich in folate include chickpeas, kidney beans, and spinach. HOME CARE INSTRUCTIONS Keep all follow-up appointments as directed by your caregiver. You may need periodic exams to check for polyps. SEEK MEDICAL CARE IF: You notice bleeding during a bowel movement. Document Released: 01/28/2004 Document Revised: 07/26/2011 Document Reviewed: 07/13/2011 [facility] Patient Information 2015 [place], [place]. This information is not intended to replace advice given to you by your health care Lugene Beougher. Make sure you discuss any questions you have with your health care Rennae Ferraiolo.

## 2015-02-06 ENCOUNTER — Other Ambulatory Visit: Payer: Self-pay

## 2015-02-06 ENCOUNTER — Ambulatory Visit (HOSPITAL_COMMUNITY)
Admission: RE | Admit: 2015-02-06 | Discharge: 2015-02-06 | Disposition: A | Discharge status: Home / Self Care | Payer: Medicaid Other | Source: Ambulatory Visit | Attending: Internal Medicine | Admitting: Internal Medicine

## 2015-02-06 ENCOUNTER — Ambulatory Visit (HOSPITAL_COMMUNITY): Payer: Medicaid Other | Admitting: Anesthesiology

## 2015-02-06 ENCOUNTER — Encounter (HOSPITAL_COMMUNITY)
Admission: RE | Disposition: A | Discharge status: Home / Self Care | Payer: Self-pay | Source: Ambulatory Visit | Attending: Internal Medicine

## 2015-02-06 ENCOUNTER — Encounter (HOSPITAL_COMMUNITY): Payer: Self-pay | Admitting: *Deleted

## 2015-02-06 DIAGNOSIS — R1031 Right lower quadrant pain: Secondary | ICD-10-CM | POA: Diagnosis present

## 2015-02-06 DIAGNOSIS — F17210 Nicotine dependence, cigarettes, uncomplicated: Secondary | ICD-10-CM | POA: Insufficient documentation

## 2015-02-06 DIAGNOSIS — R194 Change in bowel habit: Secondary | ICD-10-CM | POA: Diagnosis not present

## 2015-02-06 DIAGNOSIS — G8929 Other chronic pain: Secondary | ICD-10-CM | POA: Diagnosis not present

## 2015-02-06 DIAGNOSIS — R109 Unspecified abdominal pain: Secondary | ICD-10-CM

## 2015-02-06 HISTORY — PX: COLONOSCOPY WITH PROPOFOL: SHX5780

## 2015-02-06 SURGERY — COLONOSCOPY WITH PROPOFOL
Anesthesia: Monitor Anesthesia Care

## 2015-02-06 MED ORDER — PROPOFOL 10 MG/ML IV BOLUS
INTRAVENOUS | Status: AC
  Filled 2015-02-06: qty 20

## 2015-02-06 MED ORDER — PROPOFOL 10 MG/ML IV BOLUS
INTRAVENOUS | Status: AC
Start: 2015-02-06 — End: 2015-02-06
  Filled 2015-02-06: qty 20

## 2015-02-06 MED ORDER — FENTANYL CITRATE (PF) 100 MCG/2ML IJ SOLN
INTRAMUSCULAR | Status: AC
  Filled 2015-02-06: qty 2

## 2015-02-06 MED ORDER — STERILE WATER FOR IRRIGATION IR SOLN
Status: DC | PRN
  Administered 2015-02-06: 1000 mL

## 2015-02-06 MED ORDER — MIDAZOLAM HCL 2 MG/2ML IJ SOLN
INTRAMUSCULAR | Status: AC
  Filled 2015-02-06: qty 4

## 2015-02-06 MED ORDER — FENTANYL CITRATE (PF) 100 MCG/2ML IJ SOLN
25.0000 ug | INTRAMUSCULAR | Status: AC
  Administered 2015-02-06 (×2): 25 ug via INTRAVENOUS

## 2015-02-06 MED ORDER — GLYCOPYRROLATE 0.2 MG/ML IJ SOLN
INTRAMUSCULAR | Status: AC
  Filled 2015-02-06: qty 1

## 2015-02-06 MED ORDER — LACTATED RINGERS IV SOLN
INTRAVENOUS | Status: DC
  Administered 2015-02-06: 07:00:00 via INTRAVENOUS

## 2015-02-06 MED ORDER — ONDANSETRON HCL 4 MG/2ML IJ SOLN: 4.0000 mg | Freq: Once | INTRAMUSCULAR | Status: DC | PRN

## 2015-02-06 MED ORDER — MIDAZOLAM HCL 2 MG/2ML IJ SOLN
1.0000 mg | INTRAMUSCULAR | Status: DC | PRN
  Administered 2015-02-06 (×3): 2 mg via INTRAVENOUS

## 2015-02-06 MED ORDER — ONDANSETRON HCL 4 MG/2ML IJ SOLN
4.0000 mg | Freq: Once | INTRAMUSCULAR | Status: AC
  Administered 2015-02-06: 4 mg via INTRAVENOUS

## 2015-02-06 MED ORDER — FENTANYL CITRATE (PF) 100 MCG/2ML IJ SOLN
25.0000 ug | INTRAMUSCULAR | Status: DC | PRN
  Administered 2015-02-06 (×2): 50 ug via INTRAVENOUS

## 2015-02-06 MED ORDER — PROPOFOL 10 MG/ML IV BOLUS
INTRAVENOUS | Status: DC | PRN
  Administered 2015-02-06: 20 mg via INTRAVENOUS
  Administered 2015-02-06: 10 mg via INTRAVENOUS

## 2015-02-06 MED ORDER — FENTANYL CITRATE (PF) 250 MCG/5ML IJ SOLN
INTRAMUSCULAR | Status: AC
  Filled 2015-02-06: qty 25

## 2015-02-06 MED ORDER — LIDOCAINE HCL 1 % IJ SOLN
INTRAMUSCULAR | Status: DC | PRN
  Administered 2015-02-06: 10 mg via INTRADERMAL

## 2015-02-06 MED ORDER — MIDAZOLAM HCL 2 MG/2ML IJ SOLN
INTRAMUSCULAR | Status: AC
  Filled 2015-02-06: qty 2

## 2015-02-06 MED ORDER — PROPOFOL INFUSION 10 MG/ML OPTIME
INTRAVENOUS | Status: DC | PRN
  Administered 2015-02-06: 150 ug/kg/min via INTRAVENOUS
  Administered 2015-02-06: 08:00:00 via INTRAVENOUS

## 2015-02-06 MED ORDER — GLYCOPYRROLATE 0.2 MG/ML IJ SOLN
0.2000 mg | Freq: Once | INTRAMUSCULAR | Status: AC
  Administered 2015-02-06: 0.2 mg via INTRAVENOUS

## 2015-02-06 MED ORDER — LIDOCAINE HCL (PF) 1 % IJ SOLN
INTRAMUSCULAR | Status: AC
  Filled 2015-02-06: qty 5

## 2015-02-06 MED ORDER — ONDANSETRON HCL 4 MG/2ML IJ SOLN
INTRAMUSCULAR | Status: AC
  Filled 2015-02-06: qty 2

## 2015-02-06 SURGICAL SUPPLY — 21 items
ELECT REM PT RETURN 9FT ADLT (ELECTROSURGICAL)
ELECTRODE REM PT RTRN 9FT ADLT (ELECTROSURGICAL) IMPLANT
FCP BXJMBJMB 240X2.8X (CUTTING FORCEPS)
FLOOR PAD 36X40 (MISCELLANEOUS)
FORCEPS BIOP RAD 4 LRG CAP 4 (CUTTING FORCEPS) IMPLANT
FORCEPS BIOP RJ4 240 W/NDL (CUTTING FORCEPS)
FORCEPS BXJMBJMB 240X2.8X (CUTTING FORCEPS) IMPLANT
FORMALIN 10 PREFIL 20ML (MISCELLANEOUS) IMPLANT
INJECTOR/SNARE I SNARE (MISCELLANEOUS) IMPLANT
KIT ENDO PROCEDURE PEN (KITS) ×2 IMPLANT
MANIFOLD NEPTUNE II (INSTRUMENTS) ×2 IMPLANT
NEEDLE SCLEROTHERAPY 25GX240 (NEEDLE) IMPLANT
PAD FLOOR 36X40 (MISCELLANEOUS) IMPLANT
PROBE APC STR FIRE (PROBE) IMPLANT
PROBE INJECTION GOLD (MISCELLANEOUS)
PROBE INJECTION GOLD 7FR (MISCELLANEOUS) IMPLANT
SNARE ROTATE MED OVAL 20MM (MISCELLANEOUS) IMPLANT
SNARE SHORT THROW 13M SML OVAL (MISCELLANEOUS) IMPLANT
TRAP SPECIMEN MUCOUS 40CC (MISCELLANEOUS) IMPLANT
TUBING IRRIGATION ENDOGATOR (MISCELLANEOUS) ×2 IMPLANT
WATER STERILE IRR 1000ML POUR (IV SOLUTION) ×2 IMPLANT

## 2015-02-06 NOTE — Interval H&P Note (Signed)
History and Physical Interval Note:  02/06/2015 7:52 AM  Johnny Campos  has presented today for surgery, with the diagnosis of ABD PAIN/DIARRHEA/CONSTIPATION  The various methods of treatment have been discussed with the patient and family. After consideration of risks, benefits and other options for treatment, the patient has consented to  Procedure(s) with comments: COLONOSCOPY WITH PROPOFOL (N/A) - 800 as a surgical intervention .  The patient's history has been reviewed, patient examined, no change in status, stable for surgery.  I have reviewed the patient's chart and labs.  Questions were answered to the patient's satisfaction.     No change; tcs per plan. The risks, benefits, limitations, alternatives and imponderables have been reviewed with the patient. Questions have been answered. All parties are agreeable.   Johnny Campos

## 2015-02-06 NOTE — Addendum Note (Signed)
Addendum  created 02/06/15 7829 by Moshe Salisbury, CRNA   Modules edited: Anesthesia Flowsheet

## 2015-02-06 NOTE — Anesthesia Postprocedure Evaluation (Signed)
  Anesthesia Post-op Note  Patient: Johnny Campos  Procedure(s) Performed: Procedure(s) with comments: COLONOSCOPY WITH PROPOFOL (N/A) - Cecum time in 0814   time out 0822   total time 8 minutes  Patient Location: PACU  Anesthesia Type:MAC  Level of Consciousness: awake, alert  and oriented  Airway and Oxygen Therapy: Patient Spontanous Breathing  Post-op Pain: none  Post-op Assessment: Post-op Vital signs reviewed, Patient's Cardiovascular Status Stable, Respiratory Function Stable, Patent Airway and No signs of Nausea or vomiting              Post-op Vital Signs: Reviewed and stable  Last Vitals:  Filed Vitals:   02/06/15 0830  BP: 109/45  Temp: 36.7 C  Resp: 13    Complications: No apparent anesthesia complications

## 2015-02-06 NOTE — Anesthesia Preprocedure Evaluation (Addendum)
Anesthesia Evaluation  Patient identified by MRN, date of birth, ID band Patient awake    Reviewed: Allergy & Precautions, H&P , NPO status , Patient's Chart, lab work & pertinent test results  History of Anesthesia Complications (+) PONV  Airway Mallampati: I  TM Distance: >3 FB Neck ROM: full    Dental no notable dental hx. (+) Teeth Intact,    Pulmonary shortness of breath and with exertion, Current Smoker,    Pulmonary exam normal        Cardiovascular negative cardio ROS Normal cardiovascular exam     Neuro/Psych PSYCHIATRIC DISORDERS (ADHD) Bipolar Disorder negative neurological ROS     GI/Hepatic negative GI ROS, Neg liver ROS,   Endo/Other  negative endocrine ROS  Renal/GU negative Renal ROS     Musculoskeletal   Abdominal Normal abdominal exam  (+)   Peds  Hematology negative hematology ROS (+)   Anesthesia Other Findings   Reproductive/Obstetrics negative OB ROS                            Anesthesia Physical Anesthesia Plan  ASA: II  Anesthesia Plan: MAC   Post-op Pain Management:    Induction: Intravenous  Airway Management Planned: Simple Face Mask  Additional Equipment:   Intra-op Plan:   Post-operative Plan:   Informed Consent: I have reviewed the patients History and Physical, chart, labs and discussed the procedure including the risks, benefits and alternatives for the proposed anesthesia with the patient or authorized representative who has indicated his/her understanding and acceptance.     Plan Discussed with:   Anesthesia Plan Comments:         Anesthesia Quick Evaluation

## 2015-02-06 NOTE — H&P (View-Only) (Signed)
Primary Care Physician:  Jerrell Belfast, FNP  Primary Gastroenterologist:  Roetta Sessions, MD   Chief Complaint  Patient presents with  . Abdominal Pain  . Constipation  . Diarrhea  . Nausea  . Emesis    HPI:  Johnny Campos is a 19 y.o. male here for further evaluation of RLQ pain, alternating constipation/diarrhea, N/V. Significant GI issues at least for past one year. Initially with biliary colic, cholelithiasis for several months. Admission 07/2014 with cholecystitis, cholelithiasis s/p lap chole with intraoperative cholangiogram 08/10/14. Seen in ED 12/17/14, 11/03/14, 11/02/14.  C/o new symptoms for several months. First ER visit in June, EMS called for severe abdominal pain. Pain is in RLQ. Pain is intermittent. Happens with and without food. Usually wakes up with no pain but within couple of hours pain starts. Appetite is good. No heartburn. No nocturnal symptoms. Quit greasy foods/seeds. If pain gets severe, then develops vomiting.  Weight is stable. No melena, brbpr. BM goes from constipation to diarrhea. About 50% of each. Can have 5-6 stools per day (loose) or go week without BM. Recently started Levsin, helps a little with the pain. Denies NSAIDS/ASA.  CT A/P without CM 11/02/14 S/p cholecystectomy but otherwise unremarkable.     Current Outpatient Prescriptions  Medication Sig Dispense Refill  . acetaminophen (TYLENOL) 325 MG tablet Take 650 mg by mouth every 6 (six) hours as needed (pain).     . hyoscyamine (LEVSIN/SL) 0.125 MG SL tablet Place 1 tablet (0.125 mg total) under the tongue every 4 (four) hours as needed. 15 tablet 0  .       .        No current facility-administered medications for this visit.    Allergies as of 01/15/2015 - Review Complete 01/15/2015  Allergen Reaction Noted  . Latex Rash 01/15/2015    Past Medical History  Diagnosis Date  . ADHD (attention deficit hyperactivity disorder)   . Bipolar 1 disorder   . Shortness of breath dyspnea    exersion, from smoking    Past Surgical History  Procedure Laterality Date  . Cholecystectomy N/A 08/10/2014    Procedure: LAPAROSCOPIC CHOLECYSTECTOMY WITH INTRAOPERATIVE CHOLANGIOGRAM;  Surgeon: Ovidio Kin, MD;  Location: [facility];  Service: General;  Laterality: N/A;    Family History  Problem Relation Age of Onset  . Crohn's disease Father   . Crohn's disease Paternal Aunt   . Crohn's disease Paternal Aunt   . Colon cancer Neg Hx     Social History   Social History  . Marital Status: Single    Spouse Name: N/A  . Number of Children: N/A  . Years of Education: N/A   Occupational History  . [employer]    Social History Main Topics  . Smoking status: Current Every Day Smoker -- 0.50 packs/day for 3 years    Types: Cigarettes  . Smokeless tobacco: Not on file  . Alcohol Use: No  . Drug Use: No  . Sexual Activity: Not on file   Other Topics Concern  . Not on file   Social History Narrative   Estranged from parents. He lives with his brother and a family friend.       ROS:  General: Negative for anorexia, weight loss, fever, chills, fatigue, weakness. Eyes: Negative for vision changes.  ENT: Negative for hoarseness, difficulty swallowing , nasal congestion. CV: Negative for chest pain, angina, palpitations, dyspnea on exertion, peripheral edema.  Respiratory: Negative for dyspnea at rest, dyspnea on exertion, cough, sputum,  wheezing.  GI: See history of present illness. GU:  Negative for dysuria, hematuria, urinary incontinence, urinary frequency, nocturnal urination.  MS: Negative for joint pain, low back pain.  Derm: Negative for rash or itching.  Neuro: Negative for weakness, abnormal sensation, seizure, frequent headaches, memory loss, confusion.  Psych: Negative for anxiety, depression, suicidal ideation, hallucinations.  Endo: Negative for unusual weight change.  Heme: Negative for bruising or bleeding. Allergy: Negative for rash or hives.    Physical  Examination:  BP 118/66 mmHg  Pulse 75  Temp(Src) 98.5 F (36.9 C)  Ht  (1.854 m)  Wt 155 lb (70.308 kg)  BMI 20.45 kg/m2   General: Well-nourished, well-developed in no acute distress. Accompanied by friend. Patient is anxious. Significant anxiety noted with physical examination.  Head: Normocephalic, atraumatic.   Eyes: Conjunctiva pink, no icterus. Mouth: Oropharyngeal mucosa moist and pink , no lesions erythema or exudate. Neck: Supple without thyromegaly, masses, or lymphadenopathy.  Lungs: Clear to auscultation bilaterally.  Heart: Regular rate and rhythm, no murmurs rubs or gallops.  Abdomen: Bowel sounds are normal, mild RLQ tenderness, nondistended, no hepatosplenomegaly or masses, no abdominal bruits or    hernia , no rebound or guarding.   Rectal: no performed Extremities: No lower extremity edema. No clubbing or deformities.  Neuro: Alert and oriented x 4 , grossly normal neurologically.  Skin: Warm and dry, no rash or jaundice.   Psych: Alert and cooperative, normal mood and affect.  Labs: Lab Results  Component Value Date   WBC 9.1 12/17/2014   HGB 15.4 12/17/2014   HCT 43.7 12/17/2014   MCV 84.2 12/17/2014   PLT 227 12/17/2014   Lab Results  Component Value Date   CREATININE 0.97 12/17/2014   BUN 11 12/17/2014   NA 141 12/17/2014   K 3.4* 12/17/2014   CL 110 12/17/2014   CO2 21* 12/17/2014   Lab Results  Component Value Date   ALT 33 11/03/2014   AST 34 11/03/2014   ALKPHOS 109 11/03/2014   BILITOT 1.1 11/03/2014   Lab Results  Component Value Date   LIPASE 14* 11/03/2014     Imaging Studies:   CLINICAL DATA: Diffuse abdominal pain that radiates to the right flank.  EXAM: CT ABDOMEN AND PELVIS WITHOUT CONTRAST  TECHNIQUE: Multidetector CT imaging of the abdomen and pelvis was performed following the standard protocol without IV contrast.  COMPARISON: 03/26/2010  FINDINGS: Stable mild scarring at the posterior left lung  base. Otherwise, the lung bases are clear. Negative for free intraperitoneal air.  Gallbladder has been removed. Normal appearance of the liver, spleen, pancreas, kidneys and adrenal glands. No evidence for kidney stones or hydronephrosis. Normal appearance of the urinary bladder.  Normal appearance of the stomach, small bowel and large bowel. No acute abnormality to the appendix. No significant free fluid or lymphadenopathy.  No acute bone abnormality.  IMPRESSION: No acute abnormality in the abdomen or pelvis.   Electronically Signed  By: Richarda Overlie M.D.  On: 11/02/2014 14:12

## 2015-02-06 NOTE — Transfer of Care (Signed)
Immediate Anesthesia Transfer of Care Note  Patient: Johnny Campos  Procedure(s) Performed: Procedure(s) with comments: COLONOSCOPY WITH PROPOFOL (N/A) - Cecum time in 0814   time out 0822   total time 8 minutes  Patient Location: PACU  Anesthesia Type:MAC  Level of Consciousness: awake, alert  and oriented  Airway & Oxygen Therapy: Patient Spontanous Breathing and Patient connected to nasal cannula oxygen  Post-op Assessment: Report given to RN  Post vital signs: Reviewed and stable  Last Vitals:  Filed Vitals:   02/06/15 0755  BP: 105/65  Resp: 12    Complications: No apparent anesthesia complications

## 2015-02-06 NOTE — Op Note (Signed)
[facility] [address] [place], [address]   COLONOSCOPY PROCEDURE REPORT  PATIENT: Johnny, Campos  MR#: #[id number] BIRTHDATE: [birth year] , 19  yrs. old GENDER: male ENDOSCOPIST: R.  Roetta Sessions, MD FACP [facility] REFERRED [id number] Department [facility] PROCEDURE DATE:  03/06/2015 PROCEDURE:   Ileo-Colonoscopy, diagnostic INDICATIONS:right lower quadrant abdominal pain. MEDICATIONS: Deep sedation per Dr.  [facility] ASA CLASS:       Class II  CONSENT: The risks, benefits, alternatives and imponderables including but not limited to bleeding, perforation as well as the possibility of a missed lesion have been reviewed.  The potential for biopsy, lesion removal, etc. have also been discussed. Questions have been answered.  All parties agreeable.  Please see the history and physical in the medical record for more information.  DESCRIPTION OF PROCEDURE:   After the risks benefits and alternatives of the procedure were thoroughly explained, informed consent was obtained.  The digital rectal exam revealed no abnormalities of the rectum.   The     endoscope was introduced through the anus and advanced to the terminal ileum which was intubated for a short distance. No adverse events experienced. The quality of the prep was adequate  The instrument was then slowly withdrawn as the colon was fully examined. Estimated blood loss is zero unless otherwise noted in this procedure report.      COLON FINDINGS: Normal-appearing rectal mucosa.  Normal-appearing colonic mucosa.  The distal 20 cm of terminal ileal mucosa also appeared normal.  Retroflexion was performed. .  Withdrawal time=8 minutes 0 seconds.  The scope was withdrawn and the procedure completed. COMPLICATIONS: There were no immediate complications.  ENDOSCOPIC IMPRESSION: Normal ileocolonoscopy.  Patient has chronic abdominal pain. More right lower quadrant recently.   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   No evidence of inflammatory bowel disease. The entity of chronic appendicitis needs to be considered further.  Of note, recent Bentyl has helped some with his altered bowel function but has not really helped with right lower quadrant abdominal pain.  RECOMMENDATIONS: Surgical consultation for consideration of diagnostic laparoscopy with incidental appendectomy. Formal referral to a pain management clinic will likely be helpful depending on outcome of surgical consultation.  eSigned:  R. Roetta Sessions, MD Jerrel Ivory [facility] Mar 06, 2015 8:36 AM   cc:  CPT CODES: ICD CODES:  The ICD and CPT codes recommended by this software are interpretations from the data that the clinical staff has captured with the software.  The verification of the translation of this report to the ICD and CPT codes and modifiers is the sole responsibility of the health care institution and practicing physician where this report was generated.  [employer] will not be held responsible for the validity of the ICD and CPT codes included on this report.  AMA assumes no liability for data contained or not contained herein. CPT is a Publishing rights manager of the [employer].  PATIENT NAME:  Johnny Campos, Johnny Campos MR#: #[id number]

## 2015-02-06 NOTE — Discharge Instructions (Signed)
°  Colonoscopy Discharge Instructions  Read the instructions outlined below and refer to this sheet in the next few weeks. These discharge instructions provide you with general information on caring for yourself after you leave the hospital. Your doctor may also give you specific instructions. While your treatment has been planned according to the most current medical practices available, unavoidable complications occasionally occur. If you have any problems or questions after discharge, call Dr. Jena Gauss at [phone]. ACTIVITY  You may resume your regular activity, but move at a slower pace for the next 24 hours.   Take frequent rest periods for the next 24 hours.   Walking will help get rid of the air and reduce the bloated feeling in your belly (abdomen).   No driving for 24 hours (because of the medicine (anesthesia) used during the test).    Do not sign any important legal documents or operate any machinery for 24 hours (because of the anesthesia used during the test).  NUTRITION  Drink plenty of fluids.   You may resume your normal diet as instructed by your doctor.   Begin with a light meal and progress to your normal diet. Heavy or fried foods are harder to digest and may make you feel sick to your stomach (nauseated).   Avoid alcoholic beverages for 24 hours or as instructed.  MEDICATIONS  You may resume your normal medications unless your doctor tells you otherwise.  WHAT YOU CAN EXPECT TODAY  Some feelings of bloating in the abdomen.   Passage of more gas than usual.   Spotting of blood in your stool or on the toilet paper.  IF YOU HAD POLYPS REMOVED DURING THE COLONOSCOPY:  No aspirin products for 7 days or as instructed.   No alcohol for 7 days or as instructed.   Eat a soft diet for the next 24 hours.  FINDING OUT THE RESULTS OF YOUR TEST Not all test results are available during your visit. If your test results are not back during the visit, make an appointment  with your caregiver to find out the results. Do not assume everything is normal if you have not heard from your caregiver or the medical facility. It is important for you to follow up on all of your test results.  SEEK IMMEDIATE MEDICAL ATTENTION IF:  You have more than a spotting of blood in your stool.   Your belly is swollen (abdominal distention).   You are nauseated or vomiting.   You have a temperature over 101.   You have abdominal pain or discomfort that is severe or gets worse throughout the day.    Continue bentyl before meals and bedtime and at bedtime as needed  Surgical consultation for consideration of diagnostic laparoscopy with incidental appendectomy

## 2015-02-07 ENCOUNTER — Encounter (HOSPITAL_COMMUNITY): Payer: Self-pay | Admitting: Internal Medicine

## 2015-03-07 ENCOUNTER — Other Ambulatory Visit: Payer: Self-pay | Admitting: General Surgery

## 2015-03-07 DIAGNOSIS — R1031 Right lower quadrant pain: Secondary | ICD-10-CM

## 2015-03-14 ENCOUNTER — Ambulatory Visit
Admission: RE | Admit: 2015-03-14 | Discharge: 2015-03-14 | Disposition: A | Discharge status: Home / Self Care | Payer: Medicaid Other | Source: Ambulatory Visit | Attending: General Surgery | Admitting: General Surgery

## 2015-03-14 DIAGNOSIS — R1031 Right lower quadrant pain: Secondary | ICD-10-CM

## 2015-03-14 MED ORDER — IOPAMIDOL (ISOVUE-300) INJECTION 61%
100.0000 mL | Freq: Once | INTRAVENOUS | Status: AC | PRN
  Administered 2015-03-14: 100 mL via INTRAVENOUS

## 2015-09-18 ENCOUNTER — Emergency Department (HOSPITAL_COMMUNITY)
Admission: EM | Admit: 2015-09-18 | Discharge: 2015-09-19 | Disposition: A | Discharge status: Home / Self Care | Payer: Medicaid Other | Attending: Emergency Medicine | Admitting: Emergency Medicine

## 2015-09-18 ENCOUNTER — Emergency Department (HOSPITAL_COMMUNITY): Payer: Medicaid Other

## 2015-09-18 ENCOUNTER — Encounter (HOSPITAL_COMMUNITY): Payer: Self-pay

## 2015-09-18 DIAGNOSIS — Y998 Other external cause status: Secondary | ICD-10-CM | POA: Insufficient documentation

## 2015-09-18 DIAGNOSIS — Y9289 Other specified places as the place of occurrence of the external cause: Secondary | ICD-10-CM | POA: Insufficient documentation

## 2015-09-18 DIAGNOSIS — S7001XA Contusion of right hip, initial encounter: Secondary | ICD-10-CM | POA: Diagnosis not present

## 2015-09-18 DIAGNOSIS — W19XXXA Unspecified fall, initial encounter: Secondary | ICD-10-CM

## 2015-09-18 DIAGNOSIS — Z8659 Personal history of other mental and behavioral disorders: Secondary | ICD-10-CM | POA: Insufficient documentation

## 2015-09-18 DIAGNOSIS — F17210 Nicotine dependence, cigarettes, uncomplicated: Secondary | ICD-10-CM | POA: Diagnosis not present

## 2015-09-18 DIAGNOSIS — W010XXA Fall on same level from slipping, tripping and stumbling without subsequent striking against object, initial encounter: Secondary | ICD-10-CM | POA: Diagnosis not present

## 2015-09-18 DIAGNOSIS — Z91040 Latex allergy status: Secondary | ICD-10-CM | POA: Diagnosis not present

## 2015-09-18 DIAGNOSIS — S129XXA Fracture of neck, unspecified, initial encounter: Secondary | ICD-10-CM | POA: Diagnosis not present

## 2015-09-18 DIAGNOSIS — Y9389 Activity, other specified: Secondary | ICD-10-CM | POA: Insufficient documentation

## 2015-09-18 DIAGNOSIS — S199XXA Unspecified injury of neck, initial encounter: Secondary | ICD-10-CM | POA: Diagnosis present

## 2015-09-18 MED ORDER — HYDROCODONE-ACETAMINOPHEN 5-325 MG PO TABS
1.0000 | ORAL_TABLET | Freq: Once | ORAL | Status: AC
  Administered 2015-09-18: 1 via ORAL
  Filled 2015-09-18: qty 1

## 2015-09-18 NOTE — ED Provider Notes (Signed)
CSN: [id number]     Arrival date & time 09/18/15  2103 History  By signing my name below, I, Evelene Croon, attest that this documentation has been prepared under the direction and in the presence of non-physician practitioner, Monico Blitz, PA-C. Electronically Signed: Evelene Croon, Scribe. 09/18/2015. 10:32 PM.    Chief Complaint  Patient presents with  . Fall   The history is provided by the patient. No language interpreter was used.   HPI Comments:  Johnny Campos is a 20 y.o. male who presents to the Emergency Department s/p fall complaining of neck pain following the incident. He describes his pain as a cramping sensation. Pt reports associated right hip pain. He rates his pain a 9/10 at this time.  Pt states he slipped on slippery floor And fell forward, there was no head trauma. States he's also quickly didn't have time to put his hands out to brace his fall. He did not fall backwards, there was no trauma to the gluteal area (note that this contradicts triage note) He denies head injury and LOC. No alleviating factors noted.  Past Medical History  Diagnosis Date  . ADHD (attention deficit hyperactivity disorder)   . Bipolar 1 disorder ([place])   . Shortness of breath dyspnea     exersion, from smoking   Past Surgical History  Procedure Laterality Date  . Cholecystectomy N/A 08/10/2014    Procedure: LAPAROSCOPIC CHOLECYSTECTOMY WITH INTRAOPERATIVE CHOLANGIOGRAM;  Surgeon: Alphonsa Overall, MD;  Location: [facility];  Service: General;  Laterality: N/A;  . Colonoscopy with propofol N/A 02/06/2015    Procedure: COLONOSCOPY WITH PROPOFOL;  Surgeon: Daneil Dolin, MD;  Location: [facility];  Service: Endoscopy;  Laterality: N/A;  Cecum time in 0814   time out 0822   total time 8 minutes   Family History  Problem Relation Age of Onset  . Crohn's disease Father   . Crohn's disease Paternal Aunt   . Crohn's disease Paternal Aunt   . Colon cancer Neg Hx    Social History  Substance Use  Topics  . Smoking status: Current Every Day Smoker -- 0.50 packs/day for 3 years    Types: Cigarettes  . Smokeless tobacco: None  . Alcohol Use: No    Review of Systems  10 systems reviewed and all are negative for acute change except as noted in the HPI.   Allergies  Haldol and Latex  Home Medications   Prior to Admission medications   Medication Sig Start Date End Date Taking? Authorizing Provider  acetaminophen (TYLENOL) 325 MG tablet Take 650 mg by mouth every 6 (six) hours as needed (pain).     Historical Provider, MD  dicyclomine (BENTYL) 10 MG capsule Take one up to four times daily for abdominal pain. Hold for constipation. 01/15/15   Mahala Menghini, PA-C  peg 3350 powder (MOVIPREP) 100 G SOLR Take 1 kit (200 g total) by mouth as directed. 01/15/15   Daneil Dolin, MD  polyethylene glycol powder [facility]) powder Take one capful at bedtime on days you do not have a bowel movement. 01/15/15   Mahala Menghini, PA-C   BP 144/92 mmHg  Pulse 89  Temp(Src) 98.2 F (36.8 C) (Oral)  Resp 18  SpO2 100% Physical Exam  Constitutional: He is oriented to person, place, and time. He appears well-developed and well-nourished. No distress.  HENT:  Head: Normocephalic and atraumatic.  Mouth/Throat: Oropharynx is clear and moist.  Eyes: Conjunctivae and EOM are normal. Pupils are equal,  round, and reactive to light.  Neck: Normal range of motion. Neck supple.  + midline C-spine  tenderness to palpation No step-offs appreciated.  Grip strength, biceps, triceps 5/5 bilaterally;  can differentiate between pinprick and light touch bilaterally.   No anteriolateral hematomas/bruits   Grip/bicep/tricep strength 5/5 bilaterally. Able to differentiate between pinprick and light touch bilaterally     Cardiovascular: Normal rate, regular rhythm and intact distal pulses.   Pulmonary/Chest: Effort normal and breath sounds normal. No respiratory distress. He has no wheezes. He has no  rales. He exhibits no tenderness.  Abdominal: Soft. Bowel sounds are normal. He exhibits no distension and no mass. There is no tenderness. There is no rebound and no guarding.  Musculoskeletal: Normal range of motion. He exhibits tenderness. He exhibits no edema.  Small contusion to right anterior superior iliac crest. Full range of motion to hip. Pelvis stable, No TTP of greater trochanter bilaterally  No tenderness to percussion of Lumbar/Thoracic spinous processes. No step-offs. No paraspinal muscular TTP.   Neurological: He is alert and oriented to person, place, and time.  Strength 5/5 x4 extremities   Distal sensation intact  Skin: Skin is warm and dry.  Psychiatric: He has a normal mood and affect.  Nursing note and vitals reviewed.   ED Course  Procedures  DIAGNOSTIC STUDIES:  Oxygen Saturation is 100% on RA, normal by my interpretation.    COORDINATION OF CARE:  10:32 PM Discussed treatment plan with pt at bedside and pt agreed to plan.  Labs Review Labs Reviewed - No data to display  Imaging Review No results found. I have personally reviewed and evaluated these images and lab results as part of my medical decision-making.    MDM   Final diagnoses:  Compression fracture of cervical spine, initial encounter ([place])  Contusion of right hip, initial encounter  Fall, initial encounter    Filed Vitals:   09/18/15 2111  BP: 144/92  Pulse: 89  Temp: 98.2 F (36.8 C)  TempSrc: Oral  Resp: 18  SpO2: 100%    Medications  methocarbamol (ROBAXIN) tablet 750 mg (not administered)  HYDROcodone-acetaminophen (NORCO/VICODIN) 5-325 MG per tablet 1 tablet (1 tablet Oral Given 09/18/15 2311)    Johnny Campos is 20 y.o. male presenting with Right hip and C-spine pain status post fall from standing position. Neuro exam is nonfocal, patient has full range of motion to hip. X-ray of hip is negative. His C-spine CT showed a possible compression fracture of C5. Neuro exam  nonfocal, no signs of C-spine compression. Case discussed with neurosurgeon Dr. Arnoldo Morale who is evaluated the CT and states that he will see him in the office. Recommends  collar and outpatient evaluation for flexion and extension films.  Evaluation does not show pathology that would require ongoing emergent intervention or inpatient treatment. Pt is hemodynamically stable and mentating appropriately. Discussed findings and plan with patient/guardian, who agrees with care plan. All questions answered. Return precautions discussed and outpatient follow up given.   New Prescriptions   HYDROCODONE-ACETAMINOPHEN (NORCO/VICODIN) 5-325 MG TABLET    Take 1-2 tablets by mouth every 6 hours as needed for pain and/or cough.     I personally performed the services described in this documentation, which was scribed in my presence. The recorded information has been reviewed and is accurate.    Monico Blitz, PA-C 09/19/15 0143  Orpah Greek, MD 09/19/15 [phone]

## 2015-09-18 NOTE — ED Notes (Signed)
Pt reports he was at work, slipped on soap and fell and landed on his bottom. Pt reports pain to right hip with small bruise and has small laceration to middle finer. Pt reports neck pain but denies hitting his head on the concrete. No LOC. C-collar applied at triage.

## 2015-09-19 ENCOUNTER — Emergency Department (HOSPITAL_COMMUNITY): Payer: Medicaid Other

## 2015-09-19 MED ORDER — HYDROCODONE-ACETAMINOPHEN 5-325 MG PO TABS: ORAL_TABLET | ORAL | Status: AC

## 2015-09-19 MED ORDER — METHOCARBAMOL 500 MG PO TABS
750.0000 mg | ORAL_TABLET | Freq: Once | ORAL | Status: AC
  Administered 2015-09-19: 750 mg via ORAL
  Filled 2015-09-19: qty 2

## 2015-09-19 NOTE — Discharge Instructions (Signed)
For pain control please take ibuprofen (also known as Motrin or Advil) 800mg  (this is normally 4 over the counter pills) 3 times a day  for 5 days. Take with food to minimize stomach irritation.  Take vicodin for breakthrough pain, do not drink alcohol, drive, care for children or do other critical tasks while taking vicodin.  Please follow with your primary care doctor in the next 2 days for a check-up. They must obtain records for further management.   Do not hesitate to return to the Emergency Department for any new, worsening or concerning symptoms.   Cervical Collar A cervical collar is a device that supports your chin and the back of your head. It is used after a severe neck injury to protect your head and neck. It does this by restricting the movement of the top part of your spine, which is located in your neck. A cervical collar may be used when you have:  A fractured neck.  Ligament damage.  A spinal cord injury. WHAT INSTRUCTIONS SHOULD I FOLLOW?  Wear the collar for as long as your health care provider instructs.  Follow your health care provider's instructions about how to put on and take off your collar.  Do not make your collar so tight that you feel pain or it is hard for you to breathe.  Do not remove the collar unless your health care provider says it is okay. Ask your health care provider if you can remove the collar for showering or eating or to apply ice.  Do not drive a car until your health care provider says it is okay.  Keep all follow-up visits as directed by your health care provider. This is important. Any delay in getting necessary care can keep your injury from healing properly.  Apply ice to the injured area:  Put ice in a plastic bag.  Place a towel between your skin and the bag.  Leave the ice on for 20 minutes, 2-3 times per day for the first 2 days.   This information is not intended to replace advice given to you by your health care provider.  Make sure you discuss any questions you have with your health care provider.   Document Released: 01/24/2004 Document Revised: 05/24/2014 Document Reviewed: 12/10/2013 Elsevier Interactive Patient Education [employer]2016 [employer] Inc.

## 2015-10-28 ENCOUNTER — Emergency Department (HOSPITAL_COMMUNITY): Payer: Medicaid Other

## 2015-10-28 ENCOUNTER — Encounter (HOSPITAL_COMMUNITY): Payer: Self-pay

## 2015-10-28 ENCOUNTER — Emergency Department (HOSPITAL_COMMUNITY)
Admission: EM | Admit: 2015-10-28 | Discharge: 2015-10-28 | Disposition: A | Discharge status: Home / Self Care | Payer: Medicaid Other | Attending: Emergency Medicine | Admitting: Emergency Medicine

## 2015-10-28 DIAGNOSIS — Y929 Unspecified place or not applicable: Secondary | ICD-10-CM | POA: Diagnosis not present

## 2015-10-28 DIAGNOSIS — S0990XA Unspecified injury of head, initial encounter: Secondary | ICD-10-CM | POA: Insufficient documentation

## 2015-10-28 DIAGNOSIS — M542 Cervicalgia: Secondary | ICD-10-CM | POA: Diagnosis not present

## 2015-10-28 DIAGNOSIS — Z79891 Long term (current) use of opiate analgesic: Secondary | ICD-10-CM | POA: Insufficient documentation

## 2015-10-28 DIAGNOSIS — Z79899 Other long term (current) drug therapy: Secondary | ICD-10-CM | POA: Diagnosis not present

## 2015-10-28 DIAGNOSIS — Z91040 Latex allergy status: Secondary | ICD-10-CM | POA: Insufficient documentation

## 2015-10-28 DIAGNOSIS — Y999 Unspecified external cause status: Secondary | ICD-10-CM | POA: Diagnosis not present

## 2015-10-28 DIAGNOSIS — W01198A Fall on same level from slipping, tripping and stumbling with subsequent striking against other object, initial encounter: Secondary | ICD-10-CM | POA: Diagnosis not present

## 2015-10-28 DIAGNOSIS — F17210 Nicotine dependence, cigarettes, uncomplicated: Secondary | ICD-10-CM | POA: Insufficient documentation

## 2015-10-28 DIAGNOSIS — Y939 Activity, unspecified: Secondary | ICD-10-CM | POA: Insufficient documentation

## 2015-10-28 MED ORDER — OXYCODONE-ACETAMINOPHEN 5-325 MG PO TABS: 2.0000 | ORAL_TABLET | ORAL | Status: AC | PRN

## 2015-10-28 MED ORDER — OXYCODONE-ACETAMINOPHEN 5-325 MG PO TABS
1.0000 | ORAL_TABLET | Freq: Once | ORAL | Status: AC
  Administered 2015-10-28: 1 via ORAL
  Filled 2015-10-28: qty 1

## 2015-10-28 MED ORDER — FENTANYL CITRATE (PF) 100 MCG/2ML IJ SOLN
50.0000 ug | Freq: Once | INTRAMUSCULAR | Status: AC
  Administered 2015-10-28: 50 ug via INTRAMUSCULAR
  Filled 2015-10-28: qty 2

## 2015-10-28 NOTE — Discharge Instructions (Signed)
Take pain medication as needed. Avoid removing her c-collar at any time. Follow up with your neurosurgeon as soon as possible for reevaluation. Return to the ED if you experience loss of control of her bowel or bladder, weakness in any extremity, loss of consciousness, blurry vision, vomiting.

## 2015-10-28 NOTE — ED Provider Notes (Signed)
CSN: [id number]     Arrival date & time 10/28/15  21 History   First MD Initiated Contact with Patient 10/28/15 1650     Chief Complaint  Patient presents with  . Fall  . Head Injury     (Consider location/radiation/quality/duration/timing/severity/associated sxs/prior Treatment) HPI  Johnny Campos is a 20 y.o M with a Past medical history of bipolar 1, C3 and C5 compression fractures who presents to the ED today to be evaluated after a fall. Patient is currently undergoing treatment for C3 and C5 compression fractures. He is being seen by Neurosurgery For this. He had a MRI of his C-spine done 4 days ago. He is scheduled to follow-up with his neurosurgeon pending results. He states he is fussy when his c-collar in the meantime but today he took it off to take a shower. When he was exiting the shower he tripped over a towel and fell forward striking his head on the wall. He denied LOC but states he is feeling very dizzy and nauseous since the accident. He also reports severe increase in his neck pain that is now radiating down the left side of his body. He denies any blurry vision. He is not anticoagulated. Past Medical History  Diagnosis Date  . ADHD (attention deficit hyperactivity disorder)   . Bipolar 1 disorder ([place])   . Shortness of breath dyspnea     exersion, from smoking   Past Surgical History  Procedure Laterality Date  . Cholecystectomy N/A 08/10/2014    Procedure: LAPAROSCOPIC CHOLECYSTECTOMY WITH INTRAOPERATIVE CHOLANGIOGRAM;  Surgeon: Alphonsa Overall, MD;  Location: [facility];  Service: General;  Laterality: N/A;  . Colonoscopy with propofol N/A 02/06/2015    Procedure: COLONOSCOPY WITH PROPOFOL;  Surgeon: Daneil Dolin, MD;  Location: [facility];  Service: Endoscopy;  Laterality: N/A;  Cecum time in 0814   time out 0822   total time 8 minutes   Family History  Problem Relation Age of Onset  . Crohn's disease Father   . Crohn's disease Paternal Aunt   . Crohn's disease  Paternal Aunt   . Colon cancer Neg Hx    Social History  Substance Use Topics  . Smoking status: Current Every Day Smoker -- 0.50 packs/day for 3 years    Types: Cigarettes  . Smokeless tobacco: None  . Alcohol Use: No    Review of Systems  All other systems reviewed and are negative.     Allergies  Haldol and Latex  Home Medications   Prior to Admission medications   Medication Sig Start Date End Date Taking? Authorizing Provider  acetaminophen (TYLENOL) 325 MG tablet Take 650 mg by mouth every 6 (six) hours as needed (pain).     Historical Provider, MD  dicyclomine (BENTYL) 10 MG capsule Take one up to four times daily for abdominal pain. Hold for constipation. 01/15/15   Mahala Menghini, PA-C  HYDROcodone-acetaminophen (NORCO/VICODIN) 5-325 MG tablet Take 1-2 tablets by mouth every 6 hours as needed for pain and/or cough. 09/19/15   Nicole Pisciotta, PA-C  peg 3350 powder (MOVIPREP) 100 G SOLR Take 1 kit (200 g total) by mouth as directed. 01/15/15   Daneil Dolin, MD  polyethylene glycol powder [facility]) powder Take one capful at bedtime on days you do not have a bowel movement. 01/15/15   Mahala Menghini, PA-C   BP 151/89 mmHg  Pulse 90  Temp(Src) 98.1 F (36.7 C) (Oral)  Resp 18  Wt 70.308 kg  SpO2 100% Physical  Exam  Constitutional: He is oriented to person, place, and time. He appears well-developed and well-nourished. No distress.  HENT:  Head: Normocephalic and atraumatic.  Mouth/Throat: No oropharyngeal exudate.  Eyes: Conjunctivae and EOM are normal. Pupils are equal, round, and reactive to light. Right eye exhibits no discharge. Left eye exhibits no discharge. No scleral icterus.  Cardiovascular: Normal rate, regular rhythm, normal heart sounds and intact distal pulses.  Exam reveals no gallop and no friction rub.   No murmur heard. Pulmonary/Chest: Effort normal and breath sounds normal. No respiratory distress. He has no wheezes. He has no rales. He  exhibits no tenderness.  Abdominal: Soft. He exhibits no distension. There is no tenderness. There is no guarding.  Musculoskeletal: Normal range of motion. He exhibits no edema.  NO midline spinal tenderness.  No step offs or obvious bony deformities.  Neurological: He is alert and oriented to person, place, and time. No cranial nerve deficit. Coordination normal.  Strength 5/5 throughout. No sensory deficits.  No gait abnormality.  Skin: Skin is warm and dry. No rash noted. He is not diaphoretic. No erythema. No pallor.  Psychiatric: He has a normal mood and affect. His behavior is normal.  Nursing note and vitals reviewed.   ED Course  Procedures (including critical care time) Labs Review Labs Reviewed - No data to display  Imaging Review Ct Head Wo Contrast  10/28/2015  CLINICAL DATA:  Fall with head injury. History of C3 and C5 fractures. Initial encounter. EXAM: CT HEAD WITHOUT CONTRAST CT CERVICAL SPINE WITHOUT CONTRAST TECHNIQUE: Multidetector CT imaging of the head and cervical spine was performed following the standard protocol without intravenous contrast. Multiplanar CT image reconstructions of the cervical spine were also generated. COMPARISON:  None. FINDINGS: CT HEAD FINDINGS Skull and Sinuses:Negative for acute fracture or hemo sinus. 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 No associated sinus obstruction. Visualized orbits: Negative. Brain: Normal. No evidence of acute infarction, hemorrhage, hydrocephalus, or mass lesion/mass effect. CT CERVICAL SPINE FINDINGS Negative for acute fracture or subluxation. Chronic anterior superior corner deformity at C5. No prevertebral edema. No gross cervical canal hematoma. IMPRESSION: No evidence of acute intracranial or cervical spine injury. Electronically Signed   By: Monte Fantasia M.D.   On: 10/28/2015 18:02   Ct Cervical Spine Wo Contrast  10/28/2015   CLINICAL DATA:  Fall with head injury. History of C3 and C5 fractures. Initial encounter. EXAM: CT HEAD WITHOUT CONTRAST CT CERVICAL SPINE WITHOUT CONTRAST TECHNIQUE: Multidetector CT imaging of the head and cervical spine was performed following the standard protocol without intravenous contrast. Multiplanar CT image reconstructions of the cervical spine were also generated. COMPARISON:  None. FINDINGS: CT HEAD FINDINGS Skull and Sinuses:Negative for acute fracture or hemo sinus. 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 No associated sinus obstruction. Visualized orbits: Negative. Brain: Normal. No evidence of acute infarction, hemorrhage, hydrocephalus, or mass lesion/mass effect. CT CERVICAL SPINE FINDINGS Negative for acute fracture or subluxation. Chronic anterior superior corner deformity at C5. No prevertebral edema. No gross cervical canal hematoma. IMPRESSION: No evidence of acute intracranial or cervical spine injury. Electronically Signed   By: Monte Fantasia M.D.   On: 10/28/2015 18:02   I have personally reviewed and evaluated these images and lab results as part of my medical decision-making.   EKG Interpretation None      MDM  Final diagnoses:  Neck pain    Otherwise healthy 20 year old male currently getting ongoing treatment for compression fractures of C3 and C5 by neurosurgery presents the ED to be evaluated after a fall that occurred today. He took his c-collar off to get in the shower and fell forward striking his head. No LOC. Fall was mechanical. He has no neuro deficits noted on exam. CT head and C-spine revealed no acute abnormality. C5 deformity is stable. Pain managed in ED. Patient is instructed to keep c-collar on and follow-up with his neurosurgeon as soon as possible. Will refill short course of pain medication prescription until he can be seen right neurosurgery again later this week. Patient  is ambulatory in ED without difficulty.    Dondra Spry [place], PA-C 10/28/15 2050  Quintella Reichert, MD 10/29/15 0110

## 2015-10-28 NOTE — ED Notes (Signed)
Patient arrived in c-collar due to ongoing treatment for compression fx c3, patient took off his collar this am to take shower and slipped in shower falling forward striking head. No loc, complains of frontal headache. Vomiting x 1

## 2015-10-28 NOTE — ED Notes (Signed)
Patient transported to CT 

## 2016-02-07 ENCOUNTER — Encounter (HOSPITAL_COMMUNITY): Payer: Self-pay

## 2016-02-07 ENCOUNTER — Emergency Department (HOSPITAL_COMMUNITY)
Admission: EM | Admit: 2016-02-07 | Discharge: 2016-02-07 | Disposition: A | Discharge status: Home / Self Care | Payer: Medicaid Other | Attending: Emergency Medicine | Admitting: Emergency Medicine

## 2016-02-07 DIAGNOSIS — F17210 Nicotine dependence, cigarettes, uncomplicated: Secondary | ICD-10-CM | POA: Insufficient documentation

## 2016-02-07 DIAGNOSIS — Z91040 Latex allergy status: Secondary | ICD-10-CM | POA: Insufficient documentation

## 2016-02-07 DIAGNOSIS — F909 Attention-deficit hyperactivity disorder, unspecified type: Secondary | ICD-10-CM | POA: Insufficient documentation

## 2016-02-07 DIAGNOSIS — M5412 Radiculopathy, cervical region: Secondary | ICD-10-CM | POA: Insufficient documentation

## 2016-02-07 MED ORDER — PREDNISONE 20 MG PO TABS
60.0000 mg | ORAL_TABLET | Freq: Once | ORAL | Status: AC
  Administered 2016-02-07: 60 mg via ORAL
  Filled 2016-02-07: qty 3

## 2016-02-07 MED ORDER — PREDNISONE 20 MG PO TABS: 40.0000 mg | ORAL_TABLET | Freq: Every day | ORAL | 0 refills | Status: DC

## 2016-02-07 NOTE — ED Notes (Signed)
Declined W/C at D/C and was escorted to lobby by RN. 

## 2016-02-07 NOTE — Discharge Instructions (Signed)
Your symptoms are concerning for pinched nerve or herniated disc.  You will need call your orthopedic doctor and schedule and MRI.  Please take the Prednisone as prescribed.

## 2016-02-07 NOTE — ED Triage Notes (Signed)
Patient complains of right shoulder pain x 2 months following rehab for neck fracture, pain with any ROM. No known trauma to same

## 2016-02-07 NOTE — ED Provider Notes (Signed)
[facility] DEPT Provider Note   CSN: [id number] Arrival date & time: 02/07/16  1724   By signing my name below, I, Royce Macadamia, attest that this documentation has been prepared under the direction and in the presence of  Montine Circle, PA-C. Electronically Signed: Royce Macadamia, ED Scribe. 02/07/16. 5:55 PM.  History   Chief Complaint Chief Complaint  Patient presents with  . Shoulder Pain   The history is provided by the patient and medical records. No language interpreter was used.    HPI Comments:  Johnny Campos is a 20 y.o. male who presents to the Emergency Department complaining of right sided neck pain that radiates to his right shoulder blade and down his right arm beginning 2 months ago.  He notes associated intermittent numbness in his right arm.  Pt was seen in the ED for a compression fracture of C3 and C5 on 09/18/15.  As a result, he started physical therapy and states it made his pain worse adding that his shoulder didn't hurt until he started therapy.  His orthopedist, Dr. Gladstone Lighter ordered an MRI on 10/24/15, but no follow up MRI after physical therapy.   No alleviating factors noted.  No additional injury or complaint.    Past Medical History:  Diagnosis Date  . ADHD (attention deficit hyperactivity disorder)   . Bipolar 1 disorder ([place])   . Shortness of breath dyspnea    exersion, from smoking    Patient Active Problem List   Diagnosis Date Noted  . Abdominal pain, chronic, right lower quadrant   . Change in bowel habits   . Chronic RLQ pain 01/15/2015  . Diarrhea 01/15/2015  . Constipation 01/15/2015  . Acute cholecystitis with chronic cholecystitis 08/10/2014  . Symptomatic cholelithiasis 08/10/2014  . Gall bladder disease 08/10/2014    Past Surgical History:  Procedure Laterality Date  . CHOLECYSTECTOMY N/A 08/10/2014   Procedure: LAPAROSCOPIC CHOLECYSTECTOMY WITH INTRAOPERATIVE CHOLANGIOGRAM;  Surgeon: Alphonsa Overall, MD;   Location: [facility];  Service: General;  Laterality: N/A;  . COLONOSCOPY WITH PROPOFOL N/A 02/06/2015   Procedure: COLONOSCOPY WITH PROPOFOL;  Surgeon: Daneil Dolin, MD;  Location: [facility];  Service: Endoscopy;  Laterality: N/A;  [place] time in 0814   time out 0822   total time 8 minutes       Home Medications    Prior to Admission medications   Medication Sig Start Date End Date Taking? Authorizing Provider  acetaminophen (TYLENOL) 325 MG tablet Take 650 mg by mouth every 6 (six) hours as needed (pain).     Historical Provider, MD  dicyclomine (BENTYL) 10 MG capsule Take one up to four times daily for abdominal pain. Hold for constipation. 01/15/15   Mahala Menghini, PA-C  HYDROcodone-acetaminophen (NORCO/VICODIN) 5-325 MG tablet Take 1-2 tablets by mouth every 6 hours as needed for pain and/or cough. 09/19/15   Nicole Pisciotta, PA-C  oxyCODONE-acetaminophen (PERCOCET/ROXICET) 5-325 MG tablet Take 2 tablets by mouth every 4 (four) hours as needed for severe pain. 10/28/15   Samantha Tripp Dowless, PA-C  peg 3350 powder (MOVIPREP) 100 G SOLR Take 1 kit (200 g total) by mouth as directed. 01/15/15   Daneil Dolin, MD  polyethylene glycol powder [facility]) powder Take one capful at bedtime on days you do not have a bowel movement. 01/15/15   Mahala Menghini, PA-C  predniSONE (DELTASONE) 20 MG tablet Take 2 tablets (40 mg total) by mouth daily. 02/07/16   Montine Circle, PA-C    Family History Family  History  Problem Relation Age of Onset  . Crohn's disease Father   . Crohn's disease Paternal Aunt   . Crohn's disease Paternal Aunt   . Colon cancer Neg Hx     Social History Social History  Substance Use Topics  . Smoking status: Current Every Day Smoker    Packs/day: 0.50    Years: 3.00    Types: Cigarettes  . Smokeless tobacco: Never Used  . Alcohol use No     Allergies   Haldol [haloperidol] and Latex   Review of Systems Review of Systems  Musculoskeletal: Positive for  arthralgias, myalgias and neck pain.  Neurological: Positive for numbness.     Physical Exam Updated Vital Signs BP 134/86 (BP Location: Left Arm)   Pulse 72   Temp 98.6 F (37 C) (Oral)   Resp 18   SpO2 98%   Physical Exam  Physical Exam  Constitutional: Pt appears well-developed and well-nourished. No distress.  HENT:  Head: Normocephalic and atraumatic.  Mouth/Throat: Oropharynx is clear and moist. No oropharyngeal exudate.  Eyes: Conjunctivae are normal.  Neck: Normal range of motion. Neck supple.  No meningismus Cardiovascular: Normal rate, regular rhythm and intact distal pulses.   Pulmonary/Chest: Effort normal and breath sounds normal. No respiratory distress. Pt has no wheezes.  Abdominal: Pt exhibits no distension Musculoskeletal:  Right upper trapezius tender to palpation, no bony CTLS spine tenderness, deformity, step-off, or crepitus Lymphadenopathy: Pt has no cervical adenopathy.  Neurological: Pt is alert and oriented Speech is clear and goal oriented, follows commands Normal 5/5 strength in upper and lower extremities bilaterally including strong and equal grip strength Sensation intact Moves extremities without ataxia, coordination intact Normal gait Normal balance Positive arm squeeze test Skin: Skin is warm and dry. No rash noted. Pt is not diaphoretic. No erythema.  Psychiatric: Pt has a normal mood and affect. Behavior is normal.  Nursing note and vitals reviewed.    ED Treatments / Results   DIAGNOSTIC STUDIES:  Oxygen Saturation is 98% on RA, NML by my interpretation.    COORDINATION OF CARE:  5:55 PM Discussed treatment plan with pt at bedside and pt agreed to plan.  Labs (all labs ordered are listed, but only abnormal results are displayed) Labs Reviewed - No data to display  EKG  EKG Interpretation None       Radiology No results found.  Procedures Procedures (including critical care time)  Medications Ordered in  ED Medications  predniSONE (DELTASONE) tablet 60 mg (not administered)     Initial Impression / Assessment and Plan / ED Course  I have reviewed the triage vital signs and the nursing notes.  Pertinent labs & imaging results that were available during my care of the patient were reviewed by me and considered in my medical decision making (see chart for details).  Clinical Course    Patient with back pain.  No neurological deficits and normal neuro exam.  Patient is ambulatory.  No loss of bowel or bladder control.  Doubt cauda equina.  Denies fever,  doubt epidural abscess or other lesion. Recommend back exercises, stretching, RICE, and will treat with a short course of prednisone.  Encouraged the patient that there could be a need for additional workup and/or imaging such as MRI, if the symptoms do not resolve. Patient advised that if the back pain does not resolve, or radiates, this could progress to more serious conditions and is encouraged to follow-up with PCP or orthopedics within 2 weeks.  Final Clinical Impressions(s) / ED Diagnoses   Final diagnoses:  Cervical radiculopathy    New Prescriptions New Prescriptions   PREDNISONE (DELTASONE) 20 MG TABLET    Take 2 tablets (40 mg total) by mouth daily.   I personally performed the services described in this documentation, which was scribed in my presence. The recorded information has been reviewed and is accurate.       Montine Circle, PA-C 02/07/16 2026    Forde Dandy, MD 02/08/16 1101

## 2016-12-14 IMAGING — US US ABDOMEN LIMITED
1 series · 14 of 25 positions shown · non-contrast
Comparison: Right upper quadrant ultrasound 04/29/2014, 04/26/2014

CLINICAL DATA: Right upper quadrant abdominal pain, known
gallstones.

EXAM:
US ABDOMEN LIMITED - RIGHT UPPER QUADRANT

[Series 1: us abdomen limited · 0.21mm/px · 14 of 95 slices shown]
[im 1/95]
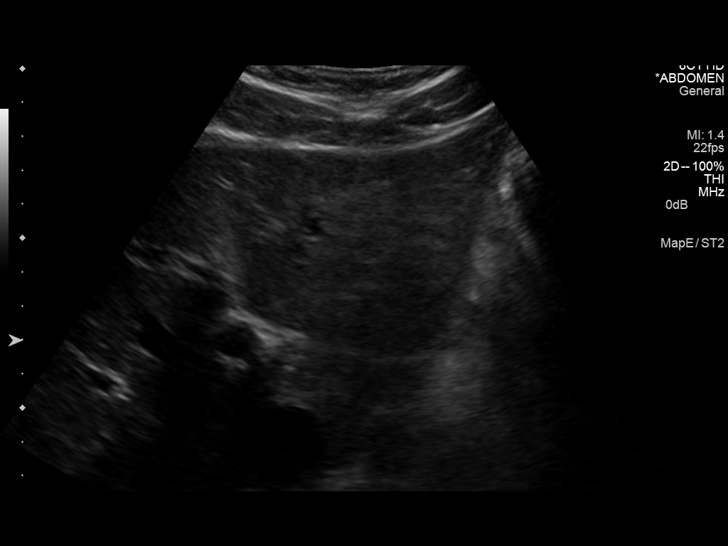
[im 8/95]
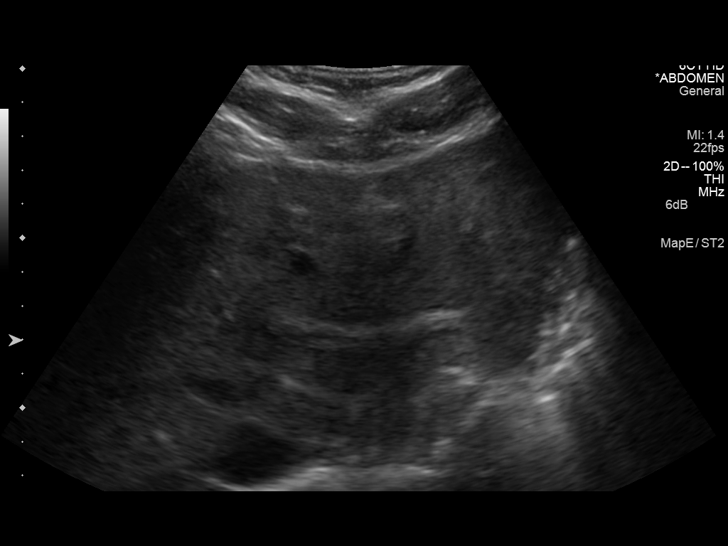
[im 16/95]
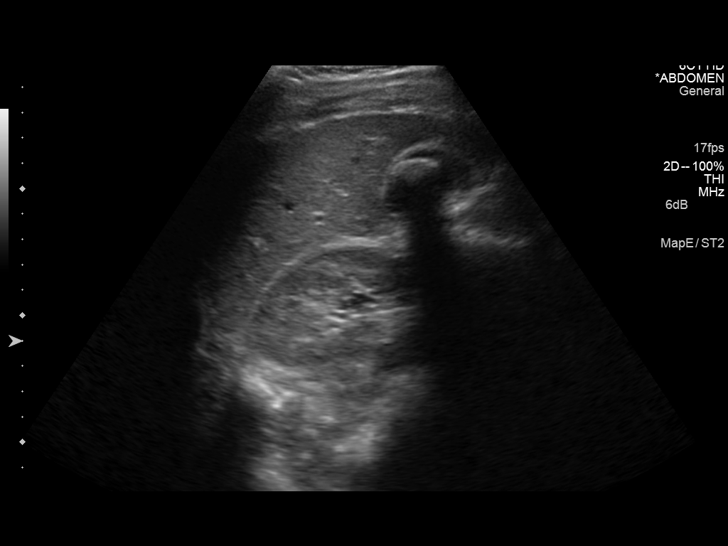
[im 24/95]
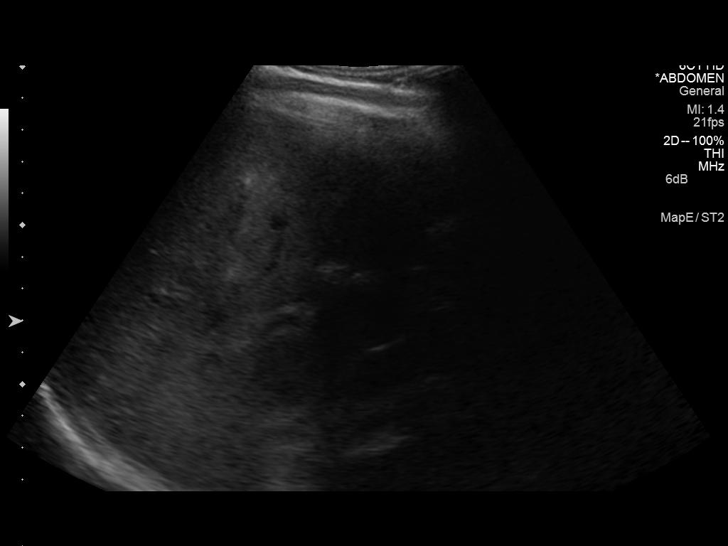
[im 32/95]
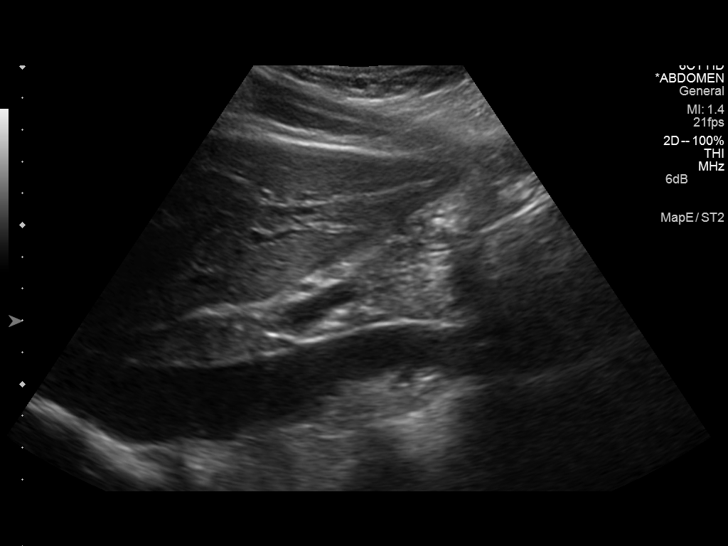
[im 36/95]
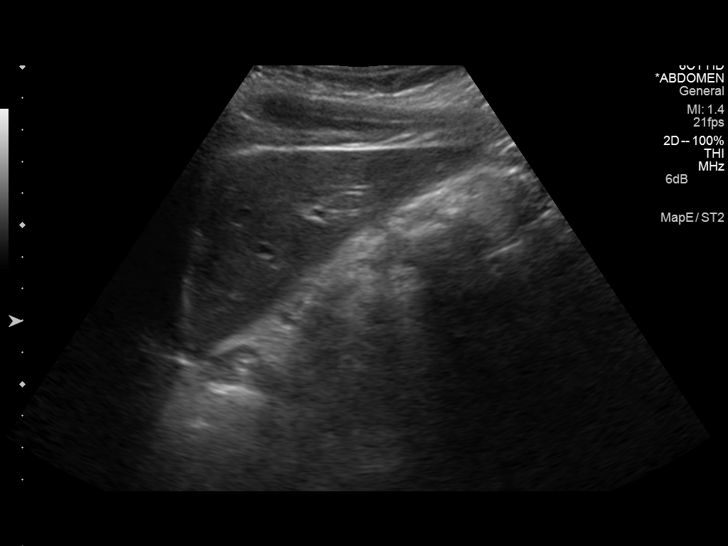
[im 44/95]
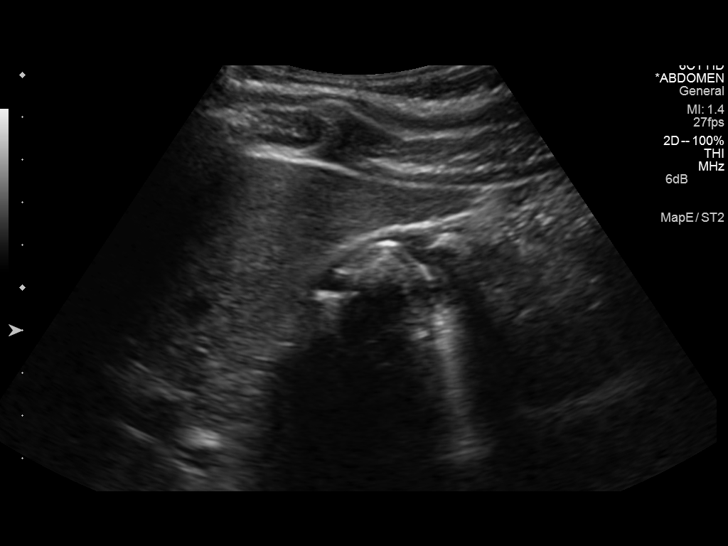
[im 51/95]
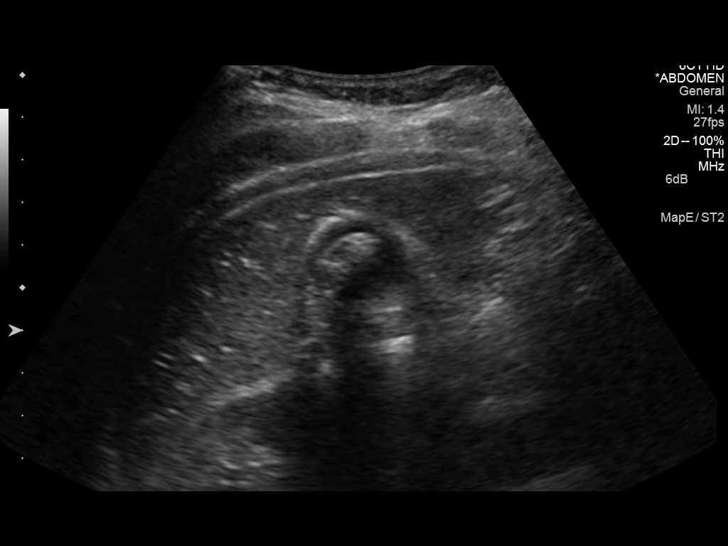
[im 59/95]
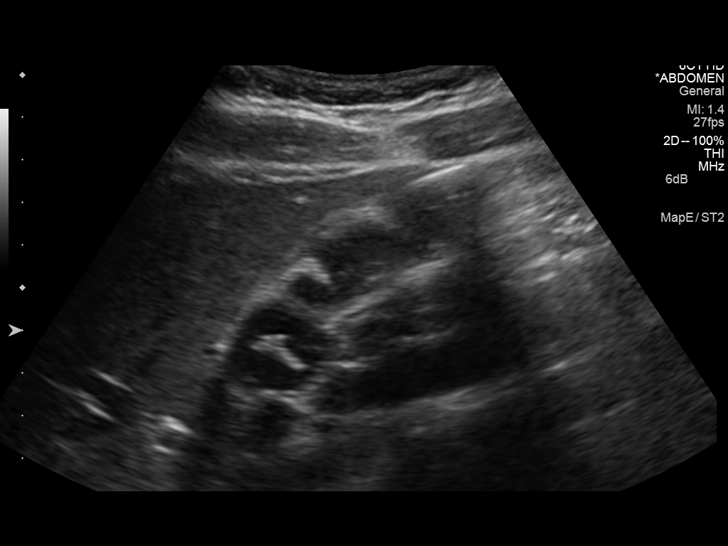
[im 63/95]
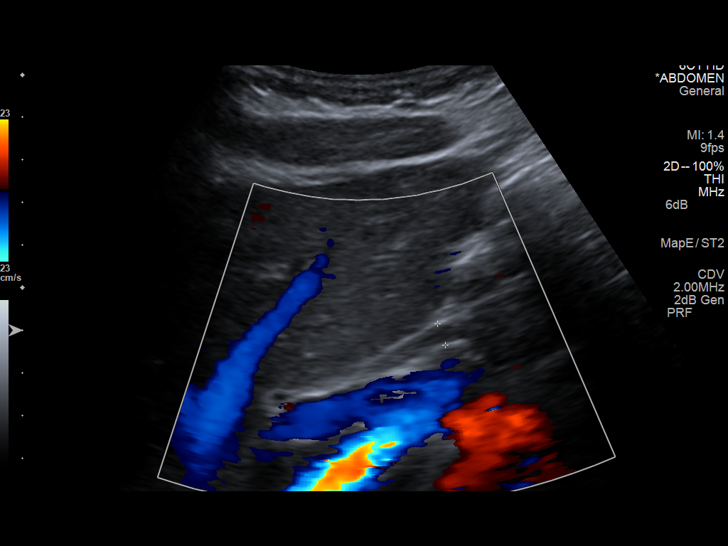
[im 71/95]
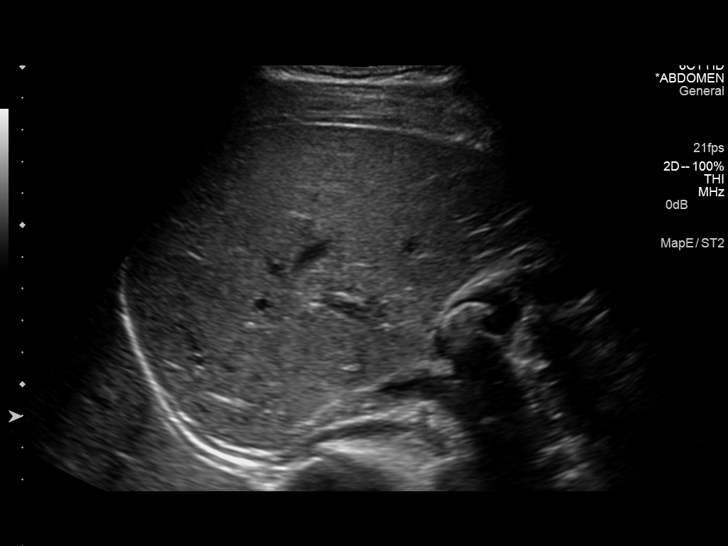
[im 79/95]
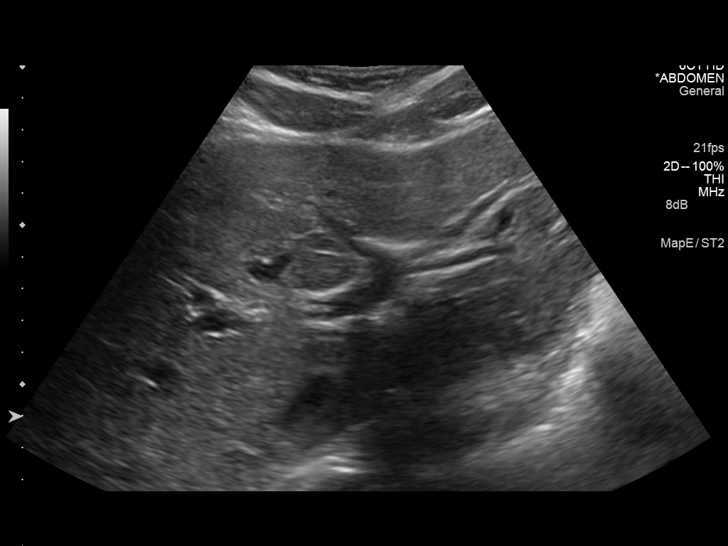
[im 87/95]
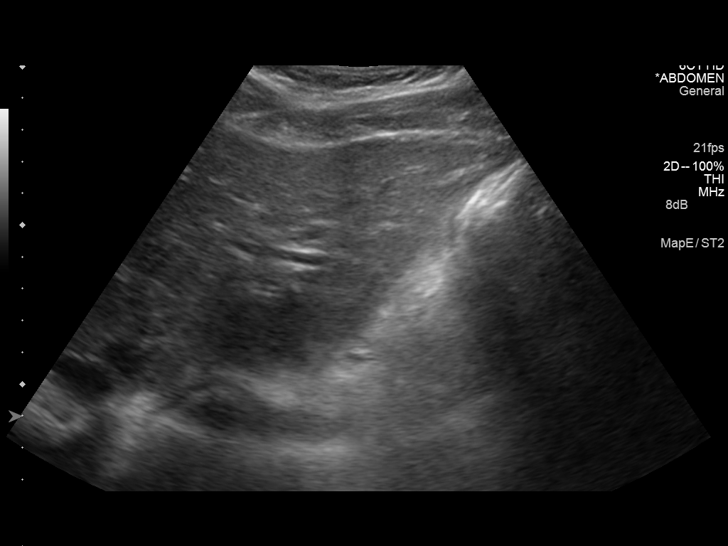
[im 95/95]
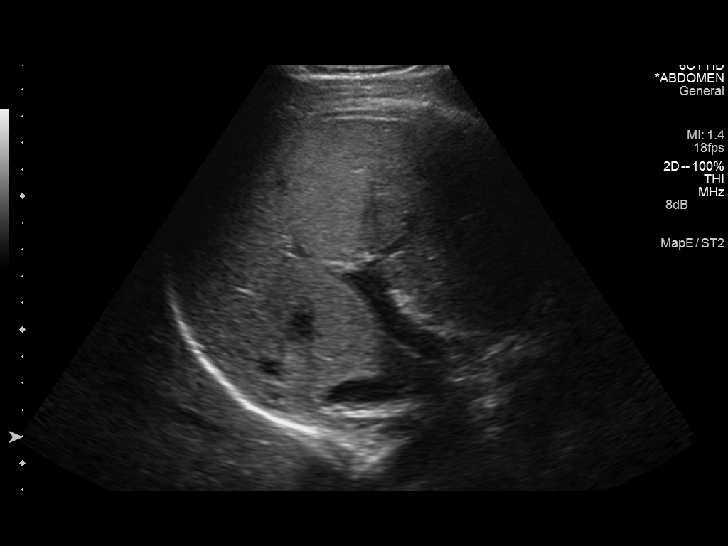

[14 of 25 positions shown; findings below may reference images not displayed]

FINDINGS: Gallbladder:

Contains multiple shadowing stones partially obscuring evaluation of
the posterior wall. Borderline wall thickening of 3.5 mm. No
sonographic Murphy sign noted. No pericholecystic fluid.

Common bile duct:

Diameter: Mildly prominent measuring 5 mm.

Liver:

No focal lesion identified. Within normal limits in parenchymal
echogenicity.
IMPRESSION: 1. Cholelithiasis. There is borderline gallbladder wall thickening
of 3.5 mm, however negative sonographic Murphy's sign at this time.
This can be seen in the setting of chronic gallbladder inflammation
versus less likely acute cholecystitis.
2. Mild prominence of the common bile duct measures 5 mm, previously
2-4 mm.
3. Nuclear medicine HIDA scan could be considered if there is
concern for acute cholecystitis.

## 2018-02-24 IMAGING — DX DG HIP (WITH OR WITHOUT PELVIS) 2-3V*R*
3 series · 3 of 3 positions shown · non-contrast
Comparison: None.

CLINICAL DATA: Right hip pain after fall on concrete today. Slipped
on soap.

EXAM:
DG HIP (WITH OR WITHOUT PELVIS) 2-3V RIGHT

[pelvis ap]
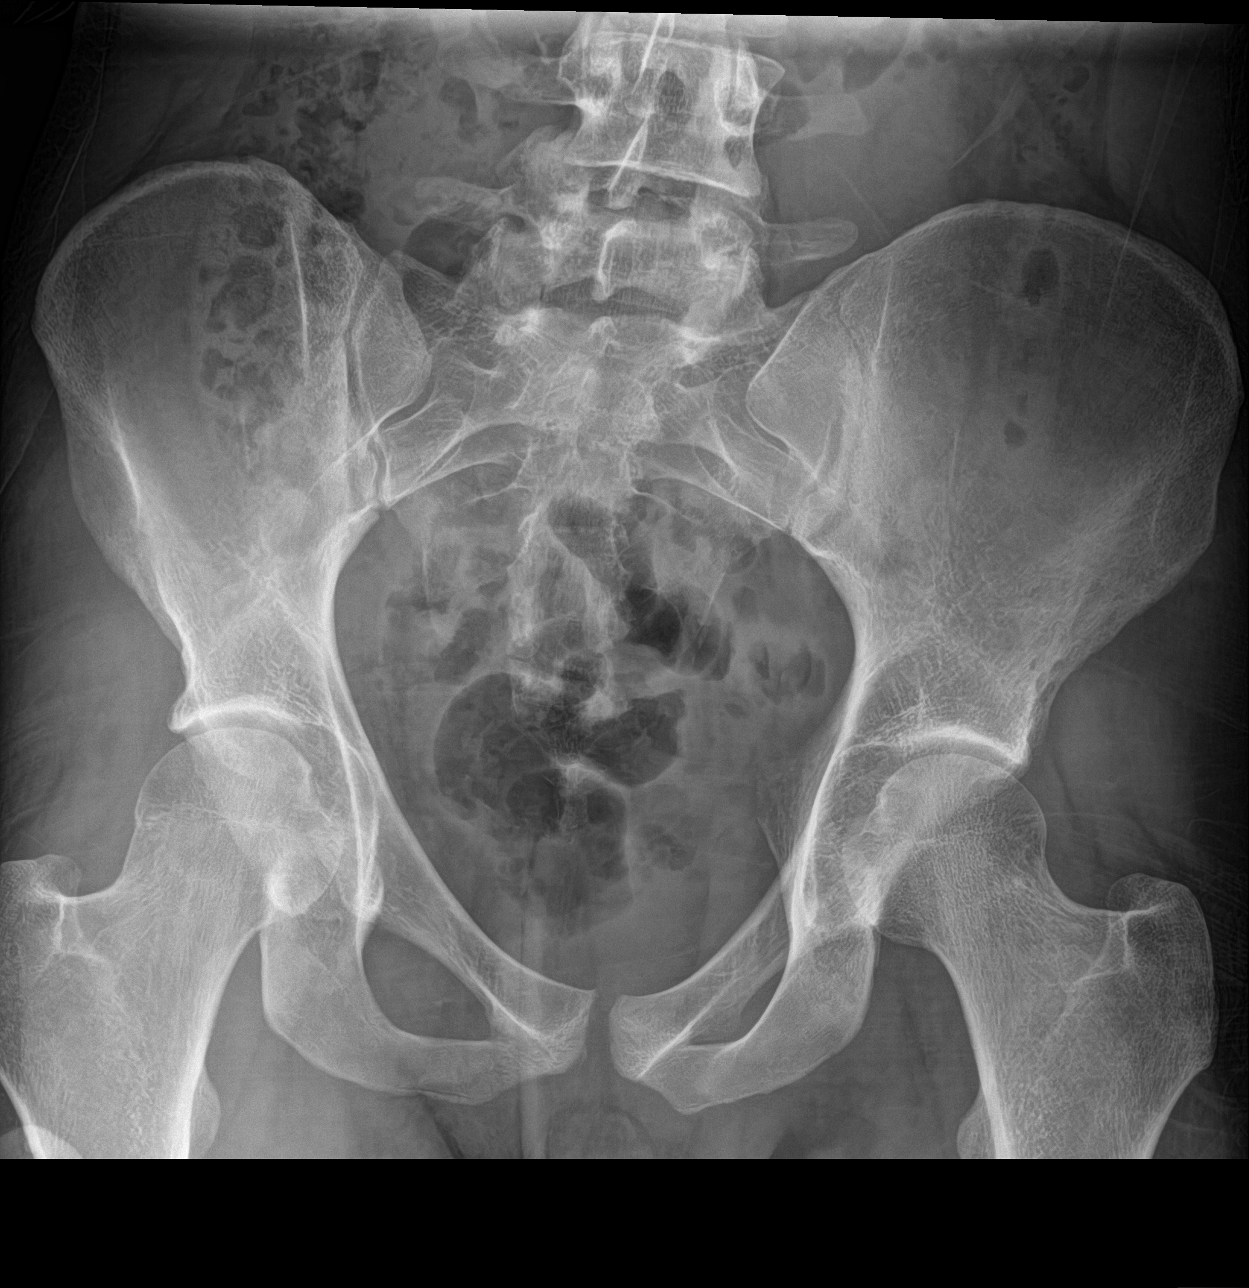

[hip ap]
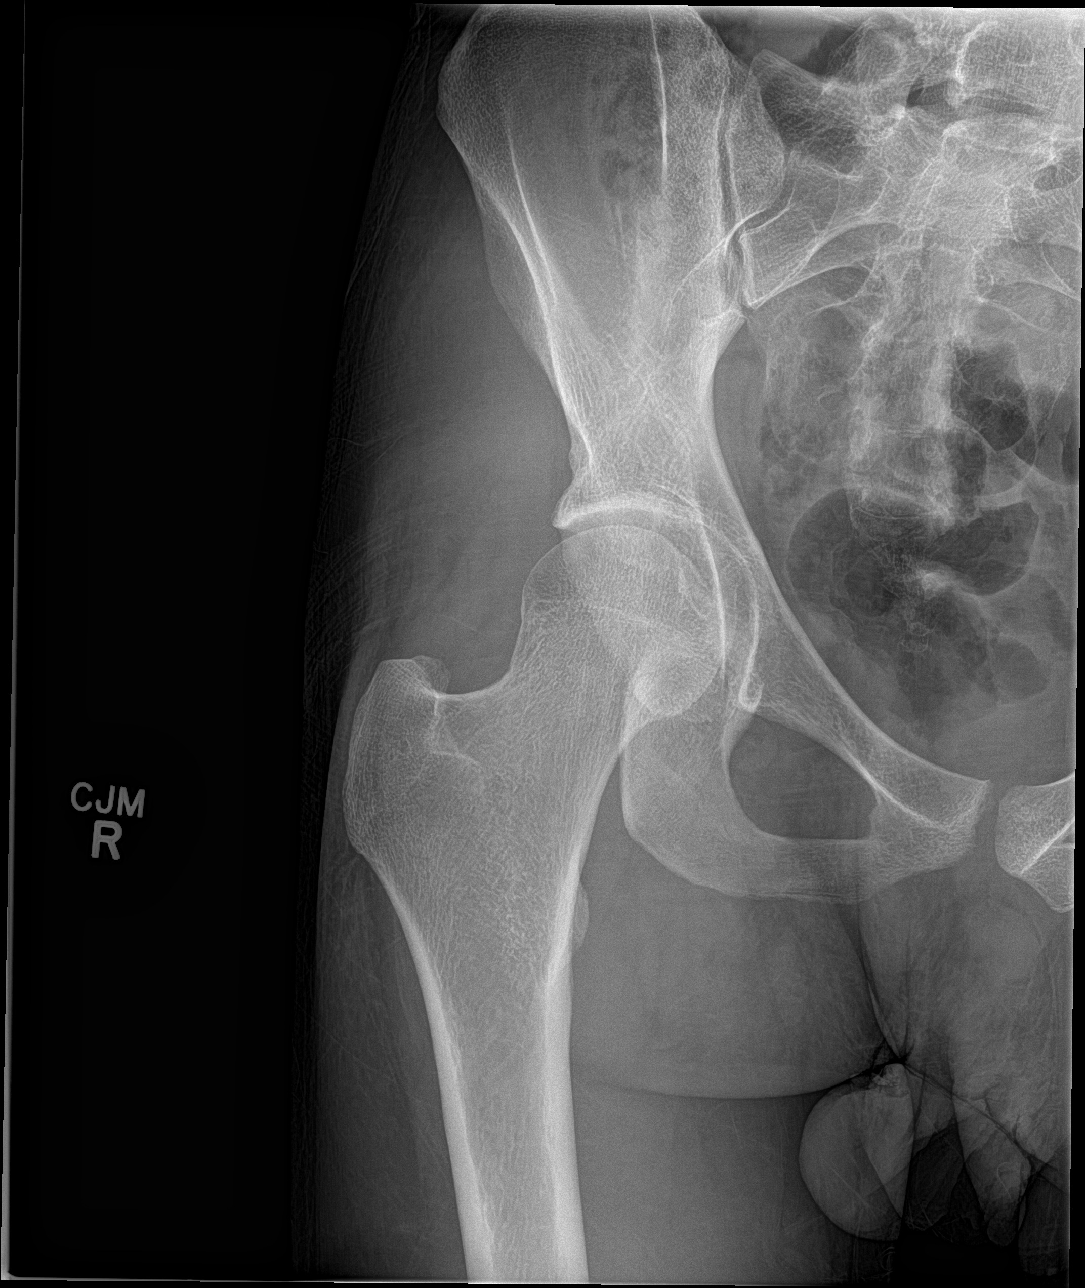

[hip lat]
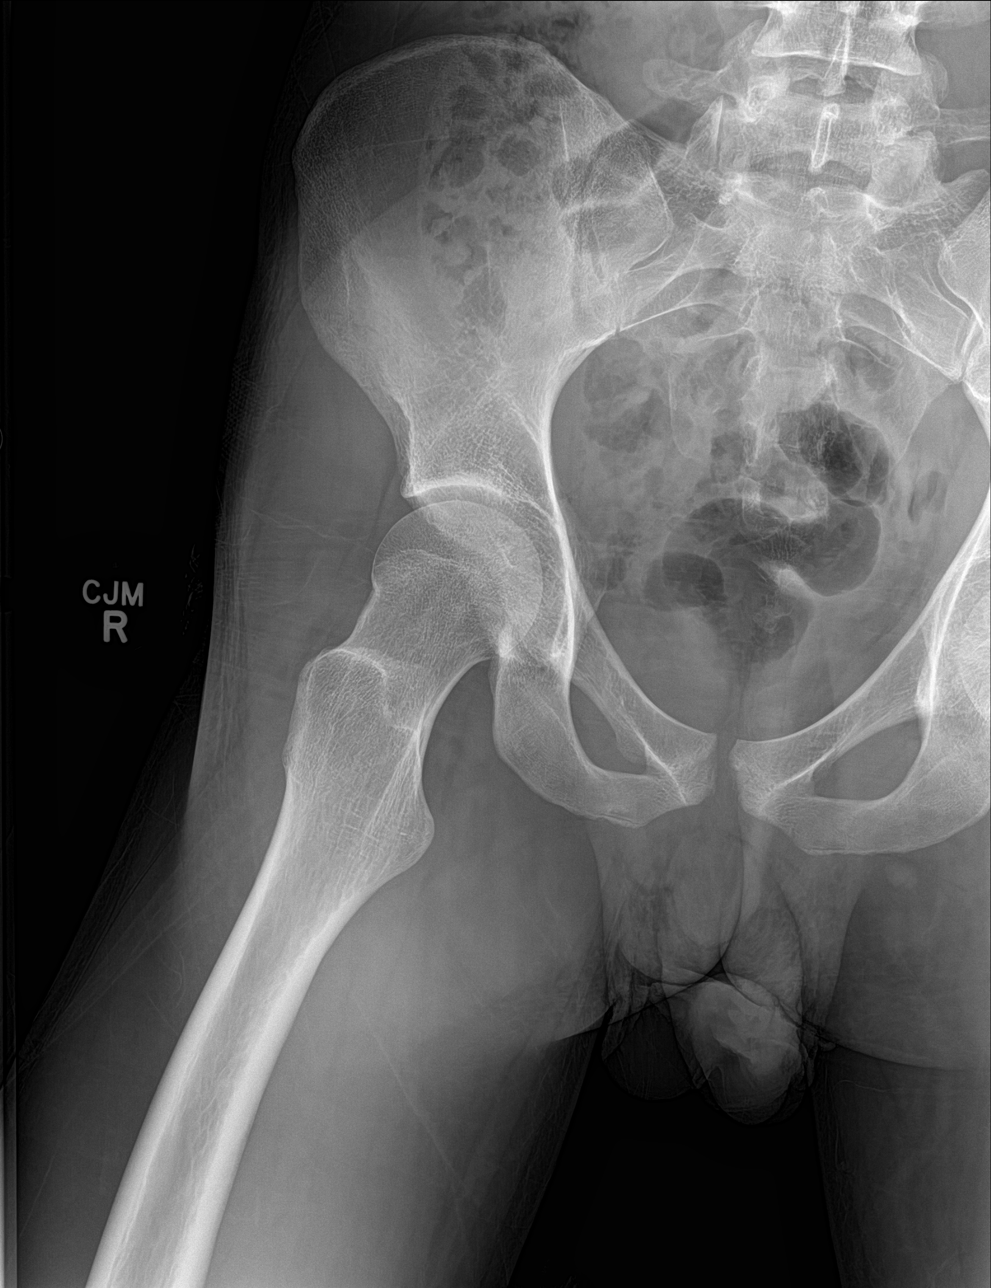

[3 of 3 positions shown; findings below may reference images not displayed]

FINDINGS: There is no evidence of hip fracture or dislocation. There is no
evidence of arthropathy or other focal bone abnormality.
IMPRESSION: Negative.

## 2018-05-23 ENCOUNTER — Telehealth: Payer: Self-pay | Admitting: Internal Medicine

## 2018-05-23 NOTE — Telephone Encounter (Signed)
Pt's mother called to say that patient needed a refill. I told her we haven't seen the patient since 12/2014 and he would need an OV first. Pt doesn't want to wait for office and doesn't want to have a bill just to get a prescription. She is wanting [place] to call in one packet of his medication. I told her again that we couldn't do that since it's been 3 years since we've seen him. She wanted to speak with the nurse or the doctor. I accidentally transferred call to CM VM. [phone]

## 2018-05-23 NOTE — Telephone Encounter (Signed)
Lmom, waiting on a return call.  

## 2018-05-23 NOTE — Telephone Encounter (Signed)
Offered pts mother an appointment and she stated it's not necessary.

## 2018-05-23 NOTE — Telephone Encounter (Signed)
Spoke to pts mother and she is aware that pt will need an office visi /elevation to discuss refilling medication.

## 2019-04-21 ENCOUNTER — Emergency Department (HOSPITAL_COMMUNITY): Payer: Self-pay

## 2019-04-21 ENCOUNTER — Other Ambulatory Visit: Payer: Self-pay

## 2019-04-21 ENCOUNTER — Emergency Department (HOSPITAL_COMMUNITY)
Admission: EM | Admit: 2019-04-21 | Discharge: 2019-04-22 | Disposition: A | Discharge status: Home / Self Care | Payer: Self-pay | Attending: Emergency Medicine | Admitting: Emergency Medicine

## 2019-04-21 ENCOUNTER — Encounter (HOSPITAL_COMMUNITY): Payer: Self-pay | Admitting: Emergency Medicine

## 2019-04-21 DIAGNOSIS — R911 Solitary pulmonary nodule: Secondary | ICD-10-CM

## 2019-04-21 DIAGNOSIS — R202 Paresthesia of skin: Secondary | ICD-10-CM | POA: Insufficient documentation

## 2019-04-21 DIAGNOSIS — M542 Cervicalgia: Secondary | ICD-10-CM | POA: Insufficient documentation

## 2019-04-21 DIAGNOSIS — F17210 Nicotine dependence, cigarettes, uncomplicated: Secondary | ICD-10-CM | POA: Insufficient documentation

## 2019-04-21 DIAGNOSIS — Z79899 Other long term (current) drug therapy: Secondary | ICD-10-CM | POA: Insufficient documentation

## 2019-04-21 HISTORY — DX: Fracture of neck, unspecified, initial encounter: S12.9XXA

## 2019-04-21 LAB — CBC WITH DIFFERENTIAL/PLATELET
Abs Immature Granulocytes: 0.02 10*3/uL (ref 0.00–0.07)
Basophils Absolute: 0 10*3/uL (ref 0.0–0.1)
Basophils Relative: 0 %
Eosinophils Absolute: 0.3 10*3/uL (ref 0.0–0.5)
Eosinophils Relative: 3 %
HCT: 41 % (ref 39.0–52.0)
Hemoglobin: 13.5 g/dL (ref 13.0–17.0)
Immature Granulocytes: 0 %
Lymphocytes Relative: 34 %
Lymphs Abs: 3.3 10*3/uL (ref 0.7–4.0)
MCH: 30 pg (ref 26.0–34.0)
MCHC: 32.9 g/dL (ref 30.0–36.0)
MCV: 91.1 fL (ref 80.0–100.0)
Monocytes Absolute: 0.8 10*3/uL (ref 0.1–1.0)
Monocytes Relative: 9 %
Neutro Abs: 5.2 10*3/uL (ref 1.7–7.7)
Neutrophils Relative %: 54 %
Platelets: 195 10*3/uL (ref 150–400)
RBC: 4.5 MIL/uL (ref 4.22–5.81)
RDW: 13.1 % (ref 11.5–15.5)
WBC: 9.7 10*3/uL (ref 4.0–10.5)
nRBC: 0 % (ref 0.0–0.2)

## 2019-04-21 LAB — BASIC METABOLIC PANEL
Anion gap: 7 (ref 5–15)
BUN: 17 mg/dL (ref 6–20)
CO2: 24 mmol/L (ref 22–32)
Calcium: 9.1 mg/dL (ref 8.9–10.3)
Chloride: 106 mmol/L (ref 98–111)
Creatinine, Ser: 0.95 mg/dL (ref 0.61–1.24)
GFR calc Af Amer: >60 mL/min (ref >60)
GFR calc non Af Amer: >60 mL/min (ref >60)
Glucose, Bld: 102 mg/dL — ABNORMAL HIGH (ref 70–99)
Potassium: 4.1 mmol/L (ref 3.5–5.1)
Sodium: 137 mmol/L (ref 135–145)

## 2019-04-21 MED ORDER — MORPHINE SULFATE (PF) 4 MG/ML IV SOLN
4.0000 mg | Freq: Once | INTRAVENOUS | Status: AC
  Administered 2019-04-21: 4 mg via INTRAMUSCULAR
  Filled 2019-04-21: qty 1

## 2019-04-21 MED ORDER — CYCLOBENZAPRINE HCL 10 MG PO TABS
5.0000 mg | ORAL_TABLET | Freq: Once | ORAL | Status: AC
  Administered 2019-04-22: 5 mg via ORAL
  Filled 2019-04-21: qty 1

## 2019-04-21 MED ORDER — HYDROMORPHONE HCL 1 MG/ML IJ SOLN
1.0000 mg | Freq: Once | INTRAMUSCULAR | Status: AC
  Administered 2019-04-21: 1 mg via INTRAVENOUS
  Filled 2019-04-21: qty 1

## 2019-04-21 MED ORDER — IOHEXOL 350 MG/ML SOLN
100.0000 mL | Freq: Once | INTRAVENOUS | Status: AC | PRN
  Administered 2019-04-21: 100 mL via INTRAVENOUS

## 2019-04-21 NOTE — ED Provider Notes (Signed)
Care assumed from [facility], PA-C at shift change. See her note for full H&P.  Per her note, "Johnny Campos is a 23 y.o. male with medical history significant for bipolar disorder, ADHD, compression fracture, previously followed by Dr. Lovell Sheehan who presents for evaluation of neck pain.  Patient states Johnny Campos was working out doing push-ups when Johnny Campos felt a pop sensation to his posterior cervical spine.  Patient states Johnny Campos felt immediate pain to this area.  Since then Johnny Campos has had tingling to his bilateral arms.  Johnny Campos denies any weakness.  No neck stiffness or neck rigidity.  Johnny Campos denies any headache, dizziness, lightheadedness, chest pain, shortness of breath, bowel or bladder incontinence, saddle paresthesias.  This occurred 3 days ago.  Johnny Campos denies any fever, IV drug use.  Has not take anything for his pain.  Johnny Campos rates his current pain a 10/10.  Not have prior surgery on his prior compression fractures.  Johnny Campos has not followed with neurosurgery over the last 2 years.  Denies additional aggravating or alleviating factors.  History obtained from patient and past medical records.  No interpreter is used."  Physical Exam  BP (!) 143/82 (BP Location: Right Arm)   Pulse 61   Temp 98.5 F (36.9 C) (Oral)   Resp 16   SpO2 97%   Physical Exam Constitutional:      General: Johnny Campos is not in acute distress.    Appearance: Johnny Campos is well-developed.  Eyes:     Conjunctiva/sclera: Conjunctivae normal.  Cardiovascular:     Rate and Rhythm: Normal rate and regular rhythm.  Pulmonary:     Effort: Pulmonary effort is normal.     Breath sounds: Normal breath sounds.  Skin:    General: Skin is warm and dry.  Neurological:     Mental Status: Johnny Campos is alert and oriented to person, place, and time.     Comments: Motor:  Normal tone. 5/5 strength of BUE and BLE major muscle groups including strong and equal grip strength and dorsiflexion/plantar flexion Sensory: sensation is grossly intact to the BUE with exception of  paresthesias to the bilat forearms (pt states chronic)      ED Course/Procedures     Procedures  Results for orders placed or performed during the hospital encounter of 04/21/19  CBC with Differential  Result Value Ref Range   WBC 9.7 4.0 - 10.5 K/uL   RBC 4.50 4.22 - 5.81 MIL/uL   Hemoglobin 13.5 13.0 - 17.0 g/dL   HCT 00.8 67.6 - 19.5 %   MCV 91.1 80.0 - 100.0 fL   MCH 30.0 26.0 - 34.0 pg   MCHC 32.9 30.0 - 36.0 g/dL   RDW 09.3 26.7 - 12.4 %   Platelets 195 150 - 400 K/uL   nRBC 0.0 0.0 - 0.2 %   Neutrophils Relative % 54 %   Neutro Abs 5.2 1.7 - 7.7 K/uL   Lymphocytes Relative 34 %   Lymphs Abs 3.3 0.7 - 4.0 K/uL   Monocytes Relative 9 %   Monocytes Absolute 0.8 0.1 - 1.0 K/uL   Eosinophils Relative 3 %   Eosinophils Absolute 0.3 0.0 - 0.5 K/uL   Basophils Relative 0 %   Basophils Absolute 0.0 0.0 - 0.1 K/uL   Immature Granulocytes 0 %   Abs Immature Granulocytes 0.02 0.00 - 0.07 K/uL  Basic metabolic panel  Result Value Ref Range   Sodium 137 135 - 145 mmol/L   Potassium 4.1 3.5 - 5.1 mmol/L  Chloride 106 98 - 111 mmol/L   CO2 24 22 - 32 mmol/L   Glucose, Bld 102 (H) 70 - 99 mg/dL   BUN 17 6 - 20 mg/dL   Creatinine, Ser 6.96 0.61 - 1.24 mg/dL   Calcium 9.1 8.9 - 29.5 mg/dL   GFR calc non Af Amer >60 >60 mL/min   GFR calc Af Amer >60 >60 mL/min   Anion gap 7 5 - 15   Ct Angio Head W Or Wo Contrast  Result Date: 04/22/2019 CLINICAL DATA:  Initial evaluation for acute posterior neck pain for past 3 days. History of prior cervical spine fracture. EXAM: CT ANGIOGRAPHY HEAD AND NECK TECHNIQUE: Multidetector CT imaging of the head and neck was performed using the standard protocol during bolus administration of intravenous contrast. Multiplanar CT image reconstructions and MIPs were obtained to evaluate the vascular anatomy. Carotid stenosis measurements (when applicable) are obtained utilizing NASCET criteria, using the distal internal carotid diameter as the  denominator. CONTRAST:  OMNIPAQUE IOHEXOL 350 MG/ML SOLN COMPARISON:  Prior study from 10/28/2015. FINDINGS: CT HEAD FINDINGS Brain: Cerebral volume within normal limits for patient age. No evidence for acute intracranial hemorrhage. No findings to suggest acute large vessel territory infarct. No mass lesion, midline shift, or mass effect. Ventricles are normal in size without evidence for hydrocephalus. No extra-axial fluid collection identified. Mild cerebellar tonsillar ectopia without frank Chiari malformation. Vascular: No hyperdense vessel identified. Skull: Scalp soft tissues demonstrate no acute abnormality. Calvarium intact. Sinuses/Orbits: Globes and orbital soft tissues within normal limits. Scattered mucosal thickening noted within the maxillary sinuses bilaterally. Superimposed right maxillary sinus retention cyst. 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 Mild mucosal thickening noted within the ethmoidal air cells. No mastoid effusion. CTA NECK FINDINGS Aortic arch: Visualized aortic arch of normal caliber with normal branch pattern. No hemodynamically significant stenosis or other abnormality about the origin of the great vessels. Visualized subclavian arteries widely patent. Right carotid system: Right common and internal carotid arteries widely patent without stenosis, dissection or occlusion. Left carotid system: Left common and internal carotid arteries widely patent without stenosis, dissection or occlusion. Vertebral arteries: Both vertebral arteries arise from the subclavian arteries. Vertebral arteries widely patent within the neck without stenosis, dissection or occlusion. Skeleton: No acute osseous abnormality. No discrete lytic or blastic osseous lesions. Mild chronic compression deformity noted at the superior endplate of C5. Other neck: No other acute soft tissue abnormality within the neck. Upper chest: 5 mm right upper lobe nodule (series 9, image 171). Additional 5  mm left upper lobe nodule (series 9, image 179). Findings are indeterminate. Visualized upper chest demonstrates no other acute finding. Review of the MIP images confirms the above findings CTA HEAD FINDINGS Anterior circulation: Both internal carotid arteries widely patent to the termini without stenosis or other abnormality. A1 segments, anterior communicating artery common anterior cerebral arteries widely patent. No M1 stenosis or occlusion. Normal MCA bifurcations. Distal MCA branches well perfused and symmetric. Posterior circulation: Both vertebral arteries widely patent to the vertebrobasilar junction without stenosis. Neither posterior inferior cerebral artery well visualized. Basilar widely patent to its distal aspect without stenosis. Superior cerebellar and posterior cerebral arteries widely patent bilaterally. Venous sinuses: Patent. Anatomic variants: None significant. No intracranial aneurysm or other vascular abnormality. Review of the MIP images confirms the above findings IMPRESSION: 1. Normal CTA of the head and neck. No evidence for dissection, large vessel occlusion, or hemodynamically significant stenosis. No aneurysm. 2. No other acute intracranial  abnormality identified. 3. Few scattered 5 mm nodules within the bilateral upper lobes as above, indeterminate. Please note that Fleischner criteria do not apply in patients of this age. Electronically Signed   By: Rise Mu M.D.   On: 04/22/2019 00:23   Ct Angio Neck W And/or Wo Contrast  Result Date: 04/22/2019 CLINICAL DATA:  Initial evaluation for acute posterior neck pain for past 3 days. History of prior cervical spine fracture. EXAM: CT ANGIOGRAPHY HEAD AND NECK TECHNIQUE: Multidetector CT imaging of the head and neck was performed using the standard protocol during bolus administration of intravenous contrast. Multiplanar CT image reconstructions and MIPs were obtained to evaluate the vascular anatomy. Carotid stenosis  measurements (when applicable) are obtained utilizing NASCET criteria, using the distal internal carotid diameter as the denominator. CONTRAST:  OMNIPAQUE IOHEXOL 350 MG/ML SOLN COMPARISON:  Prior study from 10/28/2015. FINDINGS: CT HEAD FINDINGS Brain: Cerebral volume within normal limits for patient age. No evidence for acute intracranial hemorrhage. No findings to suggest acute large vessel territory infarct. No mass lesion, midline shift, or mass effect. Ventricles are normal in size without evidence for hydrocephalus. No extra-axial fluid collection identified. Mild cerebellar tonsillar ectopia without frank Chiari malformation. Vascular: No hyperdense vessel identified. Skull: Scalp soft tissues demonstrate no acute abnormality. Calvarium intact. Sinuses/Orbits: Globes and orbital soft tissues within normal limits. Scattered mucosal thickening noted within the maxillary sinuses bilaterally. Superimposed right maxillary sinus retention cyst. 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 Mild mucosal thickening noted within the ethmoidal air cells. No mastoid effusion. CTA NECK FINDINGS Aortic arch: Visualized aortic arch of normal caliber with normal branch pattern. No hemodynamically significant stenosis or other abnormality about the origin of the great vessels. Visualized subclavian arteries widely patent. Right carotid system: Right common and internal carotid arteries widely patent without stenosis, dissection or occlusion. Left carotid system: Left common and internal carotid arteries widely patent without stenosis, dissection or occlusion. Vertebral arteries: Both vertebral arteries arise from the subclavian arteries. Vertebral arteries widely patent within the neck without stenosis, dissection or occlusion. Skeleton: No acute osseous abnormality. No discrete lytic or blastic osseous lesions. Mild chronic compression deformity noted at the superior endplate of C5. Other neck: No other  acute soft tissue abnormality within the neck. Upper chest: 5 mm right upper lobe nodule (series 9, image 171). Additional 5 mm left upper lobe nodule (series 9, image 179). Findings are indeterminate. Visualized upper chest demonstrates no other acute finding. Review of the MIP images confirms the above findings CTA HEAD FINDINGS Anterior circulation: Both internal carotid arteries widely patent to the termini without stenosis or other abnormality. A1 segments, anterior communicating artery common anterior cerebral arteries widely patent. No M1 stenosis or occlusion. Normal MCA bifurcations. Distal MCA branches well perfused and symmetric. Posterior circulation: Both vertebral arteries widely patent to the vertebrobasilar junction without stenosis. Neither posterior inferior cerebral artery well visualized. Basilar widely patent to its distal aspect without stenosis. Superior cerebellar and posterior cerebral arteries widely patent bilaterally. Venous sinuses: Patent. Anatomic variants: None significant. No intracranial aneurysm or other vascular abnormality. Review of the MIP images confirms the above findings IMPRESSION: 1. Normal CTA of the head and neck. No evidence for dissection, large vessel occlusion, or hemodynamically significant stenosis. No aneurysm. 2. No other acute intracranial abnormality identified. 3. Few scattered 5 mm nodules within the bilateral upper lobes as above, indeterminate. Please note that Fleischner criteria do not apply in patients of this age. Electronically Signed  By: Jeannine Boga M.D.   On: 04/22/2019 00:23   Mr Cervical Spine Wo Contrast  Result Date: 04/22/2019 CLINICAL DATA:  Or kink out doing pushups, neck pain EXAM: MRI CERVICAL SPINE WITHOUT CONTRAST TECHNIQUE: Multiplanar, multisequence MR imaging of the cervical spine was performed. No intravenous contrast was administered. COMPARISON:  October 28, 2015 FINDINGS: Alignment: There is straightening of the normal  cervical lordosis. Vertebrae: The vertebral body heights are well maintained. No fracture, marrow edema,or pathologic marrow infiltration. Cord: Normal signal and morphology. Posterior Fossa, vertebral arteries, paraspinal tissues: The visualized portion of the posterior fossa is unremarkable. Normal flow voids seen within the vertebral arteries. The paraspinal soft tissues are unremarkable. Disc levels: C1-C2: Atlanto-axial junction is normal, without canal narrowing C2-C3: No significant spinal canal or neural foraminal narrowing C3-C4: There is a minimal disc osteophyte complex which causes mild left neural foraminal narrowing. C4-C5: No significant spinal canal or neural foraminal narrowing C5-C6: There is minimal disc osteophyte complex with a posterior left paracentral annular fissure. There is mild bilateral neural foraminal narrowing and mild effacement anterior thecal sac. C6-C7: There is a minimal disc osteophyte complex, however no significant canal or neural foraminal narrowing. C7-T1: No significant spinal canal or neural foraminal narrowing IMPRESSION: 1. Cervical spine straightening. 2. Mild cervical spine spondylosis most notable at C5-C6 with a left paracentral annular fissure and mild bilateral neural foraminal narrowing. Electronically Signed   By: Prudencio Pair M.D.   On: 04/22/2019 00:03   Mr Thoracic Spine Wo Contrast  Result Date: 04/22/2019 CLINICAL DATA:  Mid back pain after doing pushups EXAM: MRI THORACIC SPINE WITHOUT CONTRAST TECHNIQUE: Multiplanar, multisequence MR imaging of the thoracic spine was performed. No intravenous contrast was administered. COMPARISON:  None. FINDINGS: Alignment: Normal Vertebrae: Vertebral body heights are well maintained. No fracture, marrow edema, or pathologic infiltration. Cord: Normal in signal and morphology. Paraspinal and other soft tissues: Normal appearance to the paraspinal soft tissues and retroperitoneum. Disc levels: T1-T2: No significant  canal or neural foraminal narrowing T2-T3: No significant canal or neural foraminal narrowing T3-T4: No significant canal or neural foraminal narrowing T4-T5: No significant canal or neural foraminal narrowing T5-T6: No significant canal or neural foraminal narrowing T6-T7: No significant canal or neural foraminal narrowing T7-T8: No significant canal or neural foraminal narrowing T8-T9: There is a broad-based disc bulge with a right paracentral disc protrusion which causes mild effacement anterior thecal sac. There is also mild right neural foraminal narrowing. T9-T10: No significant canal or neural foraminal narrowing T10-T11: There is a right lateral recess disc protrusion which causes mild effacement of anterior thecal sac. There is mild right neural foraminal narrowing. 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 Electronically Signed   By: Prudencio Pair M.D.   On: 04/22/2019 00:21     MDM   Briefly, patient with history of cervical compression fractures with chronic pain and intermittent bilateral upper extremity paresthesias following injury presenting for evaluation of increased pain to the neck and upper back that occurred suddenly while exercising several days ago.  At signout, pending CTA of the head/neck and MRI of the cervical and thoracic spine.  CTA of the head and neck showed normal vasculature did not show any evidence of acute intracranial abnormality.  Did have several lung nodules which the patient and his mother were  made aware of.  MRI of the cervical spine with cervical spine straightening. Mild cervical spine spondylosis most notable at C5-C6 with a left paracentral annular fissure and mild bilateral neural foraminal narrowing.   MRI thoracic  spine with mild thoracic spine spondylosis most notable at T8-T9 and T10-T11 with a right lateral recess/paracentral disc protrusions causing mild effacement anterior thecal sac and mild right neural foraminal narrowing.   Reassessed patient.  Johnny Campos reports temporary improvement of symptoms with administration of pain medications.  I repeated neurologic examination which did not show any evidence of weakness to the bilateral upper extremities.  Suspect improved physical examination now that pain is under better control.  Johnny Campos does report paresthesias to the bilateral forearms which Johnny Campos states is chronic in nature.  His work-up today is reassuring and his neurologic abnormalities are chronic in nature and unchanged today.  I do feel that Johnny Campos is appropriate for discharge with neurosurgical follow-up and strict return precautions.  Johnny Campos and his mother at bedside voiced understanding of the plan and reasons to return.  All questions answered.  Patient stable for discharge.      Rayne DuCouture, Findlay Dagher S, PA-C 04/22/19 [id number]    Palumbo, April, MD 04/22/19 [phone]

## 2019-04-21 NOTE — ED Triage Notes (Addendum)
C/o posterior neck pain since doing push-ups 3 days ago.  Reports history of cervical compression fracture 2-3 yrs ago.  Reports numbness and tingling to bilateral arms.

## 2019-04-21 NOTE — ED Notes (Signed)
Pt in MRI.

## 2019-04-21 NOTE — ED Provider Notes (Signed)
[facility] EMERGENCY DEPARTMENT Provider Note   CSN: [id number] Arrival date & time: 04/21/19  4854     History   Chief Complaint Chief Complaint  Patient presents with  . Neck Pain    HPI Johnny Campos is a 23 y.o. male with medical history significant for bipolar disorder, ADHD, compression fracture, previously followed by Dr. Arnoldo Morale who presents for evaluation of neck pain.  Patient states he was working out doing push-ups when he felt a pop sensation to his posterior cervical spine.  Patient states he felt immediate pain to this area.  Since then he has had tingling to his bilateral arms.  He denies any weakness.  No neck stiffness or neck rigidity.  He denies any headache, dizziness, lightheadedness, chest pain, shortness of breath, bowel or bladder incontinence, saddle paresthesias.  This occurred 3 days ago.  He denies any fever, IV drug use.  Has not take anything for his pain.  He rates his current pain a 10/10.  Not have prior surgery on his prior compression fractures.  He has not followed with neurosurgery over the last 2 years.  Denies additional aggravating or alleviating factors.  History obtained from patient and past medical records.  No interpreter is used.     HPI  Past Medical History:  Diagnosis Date  . ADHD (attention deficit hyperactivity disorder)   . Bipolar 1 disorder ([place])   . Neck fracture ([place])   . Shortness of breath dyspnea    exersion, from smoking    Patient Active Problem List   Diagnosis Date Noted  . Abdominal pain, chronic, right lower quadrant   . Change in bowel habits   . Chronic RLQ pain 01/15/2015  . Diarrhea 01/15/2015  . Constipation 01/15/2015  . Acute cholecystitis with chronic cholecystitis 08/10/2014  . Symptomatic cholelithiasis 08/10/2014  . Gall bladder disease 08/10/2014    Past Surgical History:  Procedure Laterality Date  . CHOLECYSTECTOMY N/A 08/10/2014   Procedure: LAPAROSCOPIC  CHOLECYSTECTOMY WITH INTRAOPERATIVE CHOLANGIOGRAM;  Surgeon: Alphonsa Overall, MD;  Location: [facility];  Service: General;  Laterality: N/A;  . COLONOSCOPY WITH PROPOFOL N/A 02/06/2015   Procedure: COLONOSCOPY WITH PROPOFOL;  Surgeon: Daneil Dolin, MD;  Location: [facility];  Service: Endoscopy;  Laterality: N/A;  Cecum time in 0814   time out 0822   total time 8 minutes        Home Medications    Prior to Admission medications   Medication Sig Start Date End Date Taking? Authorizing Provider  acetaminophen (TYLENOL) 500 MG tablet Take 1,500 mg by mouth every 6 (six) hours as needed (pain).   Yes [provider]  ibuprofen (ADVIL) 200 MG tablet Take 400 mg by mouth daily as needed (pain).   Yes [provider]  dicyclomine (BENTYL) 10 MG capsule Take one up to four times daily for abdominal pain. Hold for constipation. Patient not taking: Reported on 04/21/2019 01/15/15   Mahala Menghini, PA-C  HYDROcodone-acetaminophen (NORCO/VICODIN) 5-325 MG tablet Take 1-2 tablets by mouth every 6 hours as needed for pain and/or cough. Patient not taking: Reported on 04/21/2019 09/19/15   Pisciotta, Elmyra Ricks, PA-C  oxyCODONE-acetaminophen (PERCOCET/ROXICET) 5-325 MG tablet Take 2 tablets by mouth every 4 (four) hours as needed for severe pain. Patient not taking: Reported on 04/21/2019 10/28/15   Dowless, Aldona Bar Tripp, PA-C  peg 3350 powder (MOVIPREP) 100 G SOLR Take 1 kit (200 g total) by mouth as directed. Patient not taking: Reported on 04/21/2019 01/15/15  Rourk, Cristopher Estimable, MD  polyethylene glycol powder [facility]) powder Take one capful at bedtime on days you do not have a bowel movement. Patient not taking: Reported on 04/21/2019 01/15/15   Mahala Menghini, PA-C  haloperidol (HALDOL) 2 MG tablet Take 1 tablet (2 mg total) by mouth 2 (two) times daily as needed (anxiety). Patient not taking: Reported on 12/17/2014 11/03/14 12/17/14  Leo Grosser, MD    Family History Family History  Problem  Relation Age of Onset  . Crohn's disease Father   . Crohn's disease Paternal Aunt   . Crohn's disease Paternal Aunt   . Colon cancer Neg Hx     Social History Social History   Tobacco Use  . Smoking status: Current Every Day Smoker    Packs/day: 0.50    Years: 3.00    Pack years: 1.50    Types: Cigarettes  . Smokeless tobacco: Never Used  Substance Use Topics  . Alcohol use: No  . Drug use: No     Allergies   Haldol [haloperidol] and Latex   Review of Systems Review of Systems  Constitutional: Negative.   HENT: Negative.   Respiratory: Negative.   Cardiovascular: Negative.   Gastrointestinal: Negative.   Genitourinary: Negative.   Musculoskeletal: Positive for neck pain. Negative for arthralgias, back pain, gait problem, joint swelling, myalgias and neck stiffness.  Skin: Negative.   Neurological: Positive for numbness. Negative for dizziness, tremors, seizures, syncope, facial asymmetry, speech difficulty, weakness, light-headedness and headaches.  All other systems reviewed and are negative.    Physical Exam Updated Vital Signs BP 104/74 (BP Location: Right Arm)   Pulse 84   Temp 98.5 F (36.9 C) (Oral)   Resp 18   SpO2 99%   Physical Exam Vitals signs and nursing note reviewed.  Constitutional:      General: He is not in acute distress.    Appearance: He is well-developed. He is not ill-appearing, toxic-appearing or diaphoretic.  HENT:     Head: Normocephalic and atraumatic.     Nose: Nose normal.     Mouth/Throat:     Mouth: Mucous membranes are moist.     Pharynx: Oropharynx is clear.  Eyes:     Pupils: Pupils are equal, round, and reactive to light.  Neck:     Musculoskeletal: Neck supple. Decreased range of motion. Pain with movement, spinous process tenderness and muscular tenderness present. No edema, erythema, neck rigidity, crepitus, injury or torticollis.     Trachea: Trachea and phonation normal.      Comments: Diffuse tenderness  palpation from C3-T1.  No overlying skin changes.  No crepitus or step-offs.  Patient placed immediately in an [employer] planner. Cardiovascular:     Rate and Rhythm: Normal rate and regular rhythm.     Pulses: Normal pulses.     Heart sounds: Normal heart sounds.  Pulmonary:     Effort: Pulmonary effort is normal. No respiratory distress.     Breath sounds: Normal breath sounds.  Abdominal:     General: Bowel sounds are normal. There is no distension.     Palpations: Abdomen is soft.  Musculoskeletal:     Comments: Full range of motion bilateral extremities without difficulty.  No overlying skin changes.  Lymphadenopathy:     Cervical: No cervical adenopathy.  Skin:    General: Skin is warm and dry.     Capillary Refill: Capillary refill takes less than 2 seconds.     Comments: No edema, erythema or warmth.  No lesions.  Brisk capillary refill  Neurological:     Mental Status: He is alert and oriented to person, place, and time.     Deep Tendon Reflexes:     Reflex Scores:      Bicep reflexes are 2+ on the right side and 2+ on the left side.      Brachioradialis reflexes are 2+ on the right side and 2+ on the left side.      Patellar reflexes are 2+ on the right side and 2+ on the left side.    Comments: 4/5 strength bilateral upper extremities.  5/5 strength to bilateral lower extremities.  Decrease sensation to sharp and dull throughout entire bilateral upper extremities.  His gait is intact.  Equal reflexes throughout. 4/5 grip strength to upper extremities.  Distal 2+ pulses bilaterally.  Normal gait and balance.  Negative finger-to-nose with bilateral upper extremities.    ED Treatments / Results  Labs (all labs ordered are listed, but only abnormal results are displayed) Labs Reviewed  BASIC METABOLIC PANEL - Abnormal; Notable for the following components:      Result Value   Glucose, Bld 102 (*)    All other components within normal limits  CBC WITH DIFFERENTIAL/PLATELET     EKG None  Radiology No results found.  Procedures Procedures (including critical care time)  Medications Ordered in ED Medications  cyclobenzaprine (FLEXERIL) tablet 5 mg (has no administration in time range)  morphine 4 MG/ML injection 4 mg (4 mg Intramuscular Given 04/21/19 2056)  HYDROmorphone (DILAUDID) injection 1 mg (1 mg Intravenous Given 04/21/19 2225)  iohexol (OMNIPAQUE) 350 MG/ML injection 100 mL (100 mLs Intravenous Contrast Given 04/21/19 2301)   Initial Impression / Assessment and Plan / ED Course  I have reviewed the triage vital signs and the nursing notes.  Pertinent labs & imaging results that were available during my care of the patient were reviewed by me and considered in my medical decision making (see chart for details).  23 year old male appears otherwise well presents for evaluation of midline neck pain and paresthesias to bilateral arms x3 days.  This began after doing push-ups.  History of known cervical compression fractures previously under care of Dr. Arnoldo Morale with [place] [facility] however is not followed over the last 2 years.  He did not have surgical repair of this.  Patient with decreased strength in bilateral upper extremities as well as decreased sensation.  Midline cervical tenderness palpation without crepitus or step-offs.  Patient placed immediately in [place] cervical collar on exam.  No IV drug use, bowel or bladder incontinence, saddle paresthesias.  DTRs equal and symmetric bilaterally.  2+ radial, DP, PT pulses bilaterally. He has intact sensation to his chest wall, trunk and bilateral lower extremities.  Will obtain MR cervical and thoracic spine to rule out cervical thoracic injury.  Discussed case with Dr. Reather Converse, attending physician.  Recommends CTA head and neck to assess for vascular injury given mechanism.  Will add on basic labs as this is with contrast.  Labs personally reviewed/interpreted and are without abnormality.  Care  transferred to [facility], PA-C who will follow up on imaging. Disposition per Couture.      Final Clinical Impressions(s) / ED Diagnoses   Final diagnoses:  Neck pain  Paresthesias    ED Discharge Orders    None       Killian Schwer A, PA-C 04/21/19 3149    Elnora Morrison, MD 04/21/19 2349

## 2019-04-22 MED ORDER — NAPROXEN 500 MG PO TABS: 500.0000 mg | ORAL_TABLET | Freq: Two times a day (BID) | ORAL | 0 refills | Status: AC

## 2019-04-22 MED ORDER — METHOCARBAMOL 750 MG PO TABS: 750.0000 mg | ORAL_TABLET | Freq: Every evening | ORAL | 0 refills | Status: AC | PRN

## 2019-04-22 MED ORDER — LIDOCAINE 5 % EX PTCH: 1.0000 | MEDICATED_PATCH | CUTANEOUS | 0 refills | Status: AC

## 2019-04-22 NOTE — Discharge Instructions (Signed)
Please follow-up with your regular doctor regarding the pulmonary nodules found on your CT scan.  Please follow-up with your neurosurgeon, Dr. Arnoldo Morale for further evaluation of your symptoms.    You may alternate taking Tylenol and Naproxen as needed for pain control. You may take Naproxen twice daily as directed on your discharge paperwork and you may take  [phone] mg of Tylenol every 6 hours. Do not exceed 4000 mg of Tylenol daily as this can lead to liver damage. Also, make sure to take Naproxen with meals as it can cause an upset stomach. Do not take other NSAIDs while taking Naproxen such as (Aleve, Ibuprofen, Aspirin, Celebrex, etc) and do not take more than the prescribed dose as this can lead to ulcers and bleeding in your GI tract. You may use warm and cold compresses to help with your symptoms.   You were given a prescription for Robaxin which is a muscle relaxer.  You should not drive, work, or operate machinery while taking this medication as it can make you very drowsy.    Return to the emergency department for new or worsening symptoms.

## 2019-04-22 NOTE — ED Notes (Signed)
Patient verbalizes understanding of discharge instructions. Opportunity for questioning and answers were provided. Armband removed by staff, pt discharged from ED ambulatory.   

## 2019-05-12 ENCOUNTER — Other Ambulatory Visit: Payer: Self-pay

## 2019-05-12 ENCOUNTER — Encounter (HOSPITAL_BASED_OUTPATIENT_CLINIC_OR_DEPARTMENT_OTHER): Payer: Self-pay | Admitting: Emergency Medicine

## 2019-05-12 ENCOUNTER — Emergency Department (HOSPITAL_BASED_OUTPATIENT_CLINIC_OR_DEPARTMENT_OTHER)
Admission: EM | Admit: 2019-05-12 | Discharge: 2019-05-12 | Disposition: A | Discharge status: Home / Self Care | Payer: Medicaid Other | Attending: Emergency Medicine | Admitting: Emergency Medicine

## 2019-05-12 DIAGNOSIS — F17210 Nicotine dependence, cigarettes, uncomplicated: Secondary | ICD-10-CM | POA: Insufficient documentation

## 2019-05-12 DIAGNOSIS — Z20828 Contact with and (suspected) exposure to other viral communicable diseases: Secondary | ICD-10-CM | POA: Insufficient documentation

## 2019-05-12 DIAGNOSIS — Z91040 Latex allergy status: Secondary | ICD-10-CM | POA: Insufficient documentation

## 2019-05-12 DIAGNOSIS — J029 Acute pharyngitis, unspecified: Secondary | ICD-10-CM | POA: Insufficient documentation

## 2019-05-12 DIAGNOSIS — Z20822 Contact with and (suspected) exposure to covid-19: Secondary | ICD-10-CM

## 2019-05-12 LAB — GROUP A STREP BY PCR: Group A Strep by PCR: NOT DETECTED

## 2019-05-12 MED ORDER — PREDNISONE 50 MG PO TABS
60.0000 mg | ORAL_TABLET | Freq: Once | ORAL | Status: AC
  Administered 2019-05-12: 09:00:00 60 mg via ORAL
  Filled 2019-05-12: qty 1

## 2019-05-12 NOTE — ED Provider Notes (Signed)
[facility] EMERGENCY DEPARTMENT Provider Note   CSN: [id number] Arrival date & time: 05/12/19  8921     History No chief complaint on file.   Johnny Campos is a 23 y.o. male.  He is complaining of 3 days of right-sided throat pain.  He says it feels like there is a sharp glass when he swallows.  No fever.  Also has a right earache.  Cough nonproductive.  No prior Covid testing.  Tried some cough drops without improvement.  The history is provided by the patient.  Sore Throat This is a new problem. The current episode started more than 2 days ago. The problem occurs constantly. The problem has not changed since onset.Pertinent negatives include no chest pain, no abdominal pain, no headaches and no shortness of breath. The symptoms are aggravated by swallowing. Nothing relieves the symptoms. He has tried nothing for the symptoms. The treatment provided no relief.       Past Medical History:  Diagnosis Date  . ADHD (attention deficit hyperactivity disorder)   . Bipolar 1 disorder ([place])   . Neck fracture ([place])   . Shortness of breath dyspnea    exersion, from smoking    Patient Active Problem List   Diagnosis Date Noted  . Abdominal pain, chronic, right lower quadrant   . Change in bowel habits   . Chronic RLQ pain 01/15/2015  . Diarrhea 01/15/2015  . Constipation 01/15/2015  . Acute cholecystitis with chronic cholecystitis 08/10/2014  . Symptomatic cholelithiasis 08/10/2014  . Gall bladder disease 08/10/2014    Past Surgical History:  Procedure Laterality Date  . CHOLECYSTECTOMY N/A 08/10/2014   Procedure: LAPAROSCOPIC CHOLECYSTECTOMY WITH INTRAOPERATIVE CHOLANGIOGRAM;  Surgeon: Alphonsa Overall, MD;  Location: [facility];  Service: General;  Laterality: N/A;  . COLONOSCOPY WITH PROPOFOL N/A 02/06/2015   Procedure: COLONOSCOPY WITH PROPOFOL;  Surgeon: Daneil Dolin, MD;  Location: [facility];  Service: Endoscopy;  Laterality: N/A;  Cecum time in 0814   time out 0822    total time 8 minutes       Family History  Problem Relation Age of Onset  . Crohn's disease Father   . Crohn's disease Paternal Aunt   . Crohn's disease Paternal Aunt   . Colon cancer Neg Hx     Social History   Tobacco Use  . Smoking status: Current Every Day Smoker    Packs/day: 0.50    Years: 3.00    Pack years: 1.50    Types: Cigarettes  . Smokeless tobacco: Never Used  Substance Use Topics  . Alcohol use: No  . Drug use: No    Home Medications Prior to Admission medications   Medication Sig Start Date End Date Taking? Authorizing Provider  acetaminophen (TYLENOL) 500 MG tablet Take 1,500 mg by mouth every 6 (six) hours as needed (pain).    [provider]  dicyclomine (BENTYL) 10 MG capsule Take one up to four times daily for abdominal pain. Hold for constipation. Patient not taking: Reported on 04/21/2019 01/15/15   Mahala Menghini, PA-C  HYDROcodone-acetaminophen (NORCO/VICODIN) 5-325 MG tablet Take 1-2 tablets by mouth every 6 hours as needed for pain and/or cough. Patient not taking: Reported on 04/21/2019 09/19/15   Pisciotta, Elmyra Ricks, PA-C  ibuprofen (ADVIL) 200 MG tablet Take 400 mg by mouth daily as needed (pain).    [provider]  lidocaine (LIDODERM) 5 % Place 1 patch onto the skin daily. Remove & Discard patch within 12 hours or as directed  by MD 04/22/19   Couture, Cortni S, PA-C  oxyCODONE-acetaminophen (PERCOCET/ROXICET) 5-325 MG tablet Take 2 tablets by mouth every 4 (four) hours as needed for severe pain. Patient not taking: Reported on 04/21/2019 10/28/15   Dowless, Aldona Bar Tripp, PA-C  peg 3350 powder (MOVIPREP) 100 G SOLR Take 1 kit (200 g total) by mouth as directed. Patient not taking: Reported on 04/21/2019 01/15/15   Rourk, Cristopher Estimable, MD  polyethylene glycol powder [facility]) powder Take one capful at bedtime on days you do not have a bowel movement. Patient not taking: Reported on 04/21/2019 01/15/15   Mahala Menghini, PA-C    haloperidol (HALDOL) 2 MG tablet Take 1 tablet (2 mg total) by mouth 2 (two) times daily as needed (anxiety). Patient not taking: Reported on 12/17/2014 11/03/14 12/17/14  Leo Grosser, MD    Allergies    Haldol [haloperidol] and Latex  Review of Systems   Review of Systems  Constitutional: Negative for fever.  HENT: Positive for ear pain, sore throat and trouble swallowing.   Eyes: Negative for visual disturbance.  Respiratory: Negative for shortness of breath.   Cardiovascular: Negative for chest pain.  Gastrointestinal: Negative for abdominal pain.  Genitourinary: Negative for dysuria.  Musculoskeletal: Negative for back pain.  Skin: Negative for rash.  Neurological: Negative for headaches.    Physical Exam Updated Vital Signs BP 131/83 (BP Location: Right Arm)   Pulse 66   Temp 98.5 F (36.9 C) (Oral)   Resp 18   SpO2 100%   Physical Exam Vitals and nursing note reviewed.  Constitutional:      Appearance: He is well-developed.  HENT:     Head: Normocephalic and atraumatic.     Right Ear: Tympanic membrane and ear canal normal.     Left Ear: Tympanic membrane and ear canal normal.     Mouth/Throat:     Mouth: Mucous membranes are moist.     Pharynx: Uvula midline. Posterior oropharyngeal erythema present. No oropharyngeal exudate or uvula swelling.     Tonsils: No tonsillar exudate or tonsillar abscesses.  Eyes:     Conjunctiva/sclera: Conjunctivae normal.  Cardiovascular:     Rate and Rhythm: Normal rate and regular rhythm.     Heart sounds: No murmur.  Pulmonary:     Effort: Pulmonary effort is normal. No respiratory distress.     Breath sounds: Normal breath sounds.  Abdominal:     Palpations: Abdomen is soft.     Tenderness: There is no abdominal tenderness.  Musculoskeletal:     Cervical back: Neck supple.  Skin:    General: Skin is warm and dry.     Capillary Refill: Capillary refill takes less than 2 seconds.  Neurological:     General: No focal  deficit present.     Mental Status: He is alert.     ED Results / Procedures / Treatments   Labs (all labs ordered are listed, but only abnormal results are displayed) Labs Reviewed  GROUP A STREP BY PCR  NOVEL CORONAVIRUS, NAA (HOSP ORDER, SEND-OUT TO REF LAB; TAT 18-24 HRS)    EKG None  Radiology No results found.  Procedures Procedures (including critical care time)  Medications Ordered in ED Medications - No data to display  ED Course  I have reviewed the triage vital signs and the nursing notes.  Pertinent labs & imaging results that were available during my care of the patient were reviewed by me and considered in my medical decision making (see chart  for details).  Clinical Course as of May 11 1729  Sat May 11, 3357  5844 23 year old here with 3 days of sore throat/painful swallowing.  Differential includes viral pharyngitis, bacterial pharyngitis, Covid.    [MB]    Clinical Course User Index [MB] Hayden Rasmussen, MD   MDM Rules/Calculators/A&P                      Final Clinical Impression(s) / ED Diagnoses Final diagnoses:  Acute pharyngitis, unspecified etiology  Person under investigation for COVID-19    Rx / DC Orders ED Discharge Orders    None       Hayden Rasmussen, MD 05/12/19 1730

## 2019-05-12 NOTE — ED Triage Notes (Signed)
Sore throat x 3 days

## 2019-05-12 NOTE — ED Notes (Signed)
Pt verbalized understanding of dc instructions.

## 2019-05-12 NOTE — Discharge Instructions (Addendum)
You were evaluated in the emergency department for a sore throat.  Your strep test was negative.  Your Covid testing was pending at time of discharge and you will need to isolate until your tests are resulted in the next 1 to 2 days.  You received a dose of steroids and you should continue to do warm salt water gargles Tylenol ibuprofen and she can get some throat spray at the pharmacy.  Follow-up with your doctor return if any worsening symptoms.

## 2019-05-13 LAB — NOVEL CORONAVIRUS, NAA (HOSP ORDER, SEND-OUT TO REF LAB; TAT 18-24 HRS): SARS-CoV-2, NAA: NOT DETECTED

## 2021-11-26 ENCOUNTER — Emergency Department
Admission: EM | Admit: 2021-11-26 | Discharge: 2021-11-26 | Disposition: A | Discharge status: Home / Self Care | Payer: Self-pay | Attending: Emergency Medicine | Admitting: Emergency Medicine

## 2021-11-26 ENCOUNTER — Emergency Department (HOSPITAL_BASED_OUTPATIENT_CLINIC_OR_DEPARTMENT_OTHER): Payer: Self-pay

## 2021-11-26 ENCOUNTER — Encounter (HOSPITAL_BASED_OUTPATIENT_CLINIC_OR_DEPARTMENT_OTHER): Payer: Self-pay | Admitting: Emergency Medicine

## 2021-11-26 ENCOUNTER — Other Ambulatory Visit: Payer: Self-pay

## 2021-11-26 DIAGNOSIS — M47812 Spondylosis without myelopathy or radiculopathy, cervical region: Secondary | ICD-10-CM | POA: Diagnosis not present

## 2021-11-26 DIAGNOSIS — Z8781 Personal history of (healed) traumatic fracture: Secondary | ICD-10-CM | POA: Diagnosis not present

## 2021-11-26 DIAGNOSIS — S0990XA Unspecified injury of head, initial encounter: Secondary | ICD-10-CM | POA: Diagnosis not present

## 2021-11-26 DIAGNOSIS — S161XXA Strain of muscle, fascia and tendon at neck level, initial encounter: Secondary | ICD-10-CM | POA: Diagnosis not present

## 2021-11-26 DIAGNOSIS — W19XXXA Unspecified fall, initial encounter: Secondary | ICD-10-CM

## 2021-11-26 DIAGNOSIS — W109XXA Fall (on) (from) unspecified stairs and steps, initial encounter: Secondary | ICD-10-CM | POA: Insufficient documentation

## 2021-11-26 HISTORY — DX: Other displaced fracture of third cervical vertebra, subsequent encounter for fracture with routine healing: S12.290D

## 2021-11-26 MED ORDER — KETOROLAC 60 MG/2 ML INTRAMUSCULAR SOLUTION
60.0000 mg | INTRAMUSCULAR | Status: DC
Start: 2021-11-26 — End: 2021-11-26
  Administered 2021-11-26: 0 mg via INTRAMUSCULAR

## 2021-11-26 MED ORDER — METHYLPREDNISOLONE ACETATE 40 MG/ML SUSPENSION FOR INJECTION
80.0000 mg | Freq: Once | INTRAMUSCULAR | Status: DC
Start: 2021-11-26 — End: 2021-11-26
  Administered 2021-11-26: 0 mg via INTRAMUSCULAR

## 2021-11-26 MED ORDER — CYCLOBENZAPRINE 10 MG TABLET
10.0000 mg | ORAL_TABLET | Freq: Three times a day (TID) | ORAL | 0 refills | Status: DC | PRN
Start: 2021-11-26 — End: 2022-02-08

## 2021-11-26 MED ORDER — KETOROLAC 10 MG TABLET
10.0000 mg | ORAL_TABLET | Freq: Four times a day (QID) | ORAL | 0 refills | Status: DC | PRN
Start: 2021-11-26 — End: 2022-02-08

## 2021-11-26 MED ORDER — CYCLOBENZAPRINE 10 MG TABLET
10.0000 mg | ORAL_TABLET | ORAL | Status: DC
Start: 2021-11-26 — End: 2021-11-26
  Administered 2021-11-26: 0 mg via ORAL

## 2021-11-26 NOTE — ED Nurses Note (Signed)
Patient is in c collar from triage - patient states he fell down 3 steps and hit back of head behind left ear without LOC. Patient alert and oriented.  No complaints voiced at this time.

## 2021-11-26 NOTE — ED Triage Notes (Signed)
Going down steps and missed one, slid down hitting back of head, No hematoma felt,  denies LOC,  Back of lt side of neck tender upon palpation, C Collar applied, (hx of C4-C5 compression Fx), numbness to Rt arm that has a history of coming and going. But numb now.

## 2021-11-26 NOTE — ED Provider Notes (Signed)
[facility], [facility] Emergency Department  ED Primary Provider Note  History of Present Illness   Chief Complaint   Patient presents with   . Fall     Christian Huffman is a 26 y.o. male who had concerns including Fall.  Arrival: The patient arrived by Car complaining neck pain this morning after missing a step and falling down several steps hitting the back of his head and neck.  Patient states 2 years ago he fell and had a compression fracture of C4 and C5.  Patient has been having numbness and tingling going down the right arm.  Patient states he has had that in the past that comes and goes.  Patient feels that he can not turn his neck without severe pain.  Patient has not tried to flex or extend his neck.  Patient denies any pain anywhere else on his body.  Patient denies any weakness on 1 side.  Patient denies being on any blood thinners.  Patient denies any severe headache.  No blurred or double vision.    HPI  Review of Systems   Review of Systems   Constitutional: Positive for activity change. Negative for chills and fever.   HENT: Negative for ear pain and sore throat.    Eyes: Negative for pain and visual disturbance.   Respiratory: Negative for cough and shortness of breath.    Cardiovascular: Negative for chest pain and palpitations.   Gastrointestinal: Negative for abdominal pain and vomiting.   Genitourinary: Negative for dysuria and hematuria.   Musculoskeletal: Positive for myalgias, neck pain and neck stiffness. Negative for arthralgias and back pain.   Skin: Negative for color change and rash.   Neurological: Negative for seizures and syncope.   All other systems reviewed and are negative.     Historical Data   History Reviewed This Encounter: Medical History  Surgical History  Family History  Social History      Physical Exam   ED Triage Vitals [11/26/21 0842]   BP (Non-Invasive) (!) 146/89   Heart Rate 66   Respiratory Rate 14   Temperature 36.6 C (97.8 F)    SpO2 100 %   Weight 75.3 kg (166 lb)   Height 1.905 m (6\' 3" )     Physical Exam  Vitals and nursing note reviewed.   Constitutional:       General: He is not in acute distress.     Appearance: He is well-developed and normal weight.   HENT:      Head: Normocephalic and atraumatic.      Right Ear: External ear normal.      Left Ear: External ear normal.      Nose: Nose normal.      Mouth/Throat:      Mouth: Mucous membranes are moist.   Eyes:      Extraocular Movements: Extraocular movements intact.      Conjunctiva/sclera: Conjunctivae normal.      Pupils: Pupils are equal, round, and reactive to light.   Neck:      Comments: Positive tenderness over C5-C6.  No swelling or ecchymosis.  No crepitus.  No weakness on the right arm.  Cardiovascular:      Rate and Rhythm: Normal rate and regular rhythm.      Pulses: Normal pulses.      Heart sounds: Normal heart sounds. No murmur heard.  Pulmonary:      Effort: Pulmonary effort is normal. No respiratory distress.  Breath sounds: Normal breath sounds.   Abdominal:      General: Bowel sounds are normal.      Palpations: Abdomen is soft.      Tenderness: There is no abdominal tenderness.   Musculoskeletal:         General: No swelling. Normal range of motion.      Cervical back: Neck supple. Tenderness present.   Skin:     General: Skin is warm and dry.      Capillary Refill: Capillary refill takes less than 2 seconds.   Neurological:      General: No focal deficit present.      Mental Status: He is alert and oriented to person, place, and time.   Psychiatric:         Mood and Affect: Mood normal.         Behavior: Behavior normal.         Thought Content: Thought content normal.       Patient Data   Labs Ordered/Reviewed - No data to display  CT CERVICAL SPINE WO IV CONTRAST   Final Result by Edi, Radresults In (07/13 0925)   NO ACUTE CERVICAL FRACTURE         One or more dose reduction techniques were used (e.g., Automated exposure control, adjustment of the mA  and/or kV according to patient size, use of iterative reconstruction technique).         Radiologist location ID: [id number]           Medical Decision Making        Medical Decision Making  Patient is 26 year old white male complaining of falling down a flight of steps hitting the back of his head and neck.  Patient is complaining of neck pain presently.  Patient is also complaining of numbness tingling going down the right arm.  Patient has had numbness and tingling for several years after falling and having a compression fracture of C4 and C5.  Patient denies any weakness to the right side of his body.  Patient will go through CT scan of his cervical column.  Patient will then be given pain medication as well as a muscle relaxant.  Patient will also be giving injection of steroid.  Patient will then be sent to orthopedics as consult in the next several days.    Amount and/or Complexity of Data Reviewed  Radiology: ordered.               Medications Administered in the ED   ketorolac (TORADOL) 60mg /2 mL IM injection (has no administration in time range)   methylPREDNISolone acetate (DEPO-MEDROL) 40 mg/mL injection (has no administration in time range)   cyclobenzaprine (FLEXERIL) tablet (has no administration in time range)     Clinical Impression   Cervical strain (Primary)   DJD (degenerative joint disease), cervical   Fall   Closed head injury       Disposition: Discharged               Clinical Impression   Cervical strain (Primary)   DJD (degenerative joint disease), cervical   Fall   Closed head injury       Current Discharge Medication List      START taking these medications    Details   cyclobenzaprine (FLEXERIL) 10 mg Oral Tablet Take 1 Tablet (10 mg total) by mouth Three times a day as needed for Muscle spasms  Qty: 12 Tablet, Refills: 0      ketorolac  tromethamine (TORADOL) 10 mg Oral Tablet Take 1 Tablet (10 mg total) by mouth Every 6 hours as needed for Pain  Qty: 20 Tablet, Refills: 0

## 2022-02-08 ENCOUNTER — Other Ambulatory Visit: Payer: Self-pay

## 2022-02-08 ENCOUNTER — Emergency Department
Admission: EM | Admit: 2022-02-08 | Discharge: 2022-02-08 | Disposition: A | Discharge status: Home / Self Care | Payer: Medicaid - Out of State | Attending: Family | Admitting: Family

## 2022-02-08 ENCOUNTER — Encounter (HOSPITAL_BASED_OUTPATIENT_CLINIC_OR_DEPARTMENT_OTHER): Payer: Self-pay

## 2022-02-08 DIAGNOSIS — B349 Viral infection, unspecified: Secondary | ICD-10-CM | POA: Insufficient documentation

## 2022-02-08 DIAGNOSIS — Z20822 Contact with and (suspected) exposure to covid-19: Secondary | ICD-10-CM | POA: Insufficient documentation

## 2022-02-08 LAB — COVID-19, FLU A/B, RSV RAPID BY PCR
INFLUENZA VIRUS TYPE A: NOT DETECTED
INFLUENZA VIRUS TYPE B: NOT DETECTED
RESPIRATORY SYNCTIAL VIRUS (RSV): NOT DETECTED
SARS-CoV-2: NOT DETECTED

## 2022-02-08 LAB — RAPID THROAT SCREEN, STREPTOCOCCUS, WITH REFLEX: THROAT RAPID SCREEN, STREPTOCOCCUS: NEGATIVE

## 2022-02-08 MED ORDER — GUAIFENESIN ER 600 MG TABLET, EXTENDED RELEASE 12 HR
600.0000 mg | EXTENDED_RELEASE_TABLET | Freq: Two times a day (BID) | ORAL | Status: DC
Start: 2022-02-08 — End: 2022-02-15

## 2022-02-08 MED ORDER — IBUPROFEN 600 MG TABLET
ORAL_TABLET | ORAL | Status: AC
Start: 2022-02-08 — End: 2022-02-08
  Filled 2022-02-08: qty 1

## 2022-02-08 MED ORDER — GUAIFENESIN 100 MG/5 ML ORAL LIQUID
200.0000 mg | ORAL | Status: AC
Start: 2022-02-08 — End: 2022-02-08
  Administered 2022-02-08: 200 mg via ORAL

## 2022-02-08 MED ORDER — GUAIFENESIN 100 MG/5 ML ORAL LIQUID
ORAL | Status: AC
Start: 2022-02-08 — End: 2022-02-08
  Filled 2022-02-08: qty 10

## 2022-02-08 MED ORDER — IBUPROFEN 600 MG TABLET
600.0000 mg | ORAL_TABLET | ORAL | Status: AC
Start: 2022-02-08 — End: 2022-02-08
  Administered 2022-02-08: 600 mg via ORAL

## 2022-02-08 NOTE — ED Triage Notes (Signed)
Patient states started this morning, congested, nasal stuffiness, HA, nauseated, and sore throat.

## 2022-02-08 NOTE — ED Provider Notes (Signed)
[facility], [facility] Emergency Department  ED Primary Provider Note  History of Present Illness   Chief Complaint   Patient presents with    Flu Like Symptoms     Christian Huffman is a 26 y.o. male who had concerns including Flu Like Symptoms.  Arrival: The patient arrived by Car    Patient 26 year old male to the emergency room complaining of rhinorrhea, congestion, headache and diffuse abdominal pain.  Patient states he has had these symptoms ongoing x1 day.  Patient states he took some Tylenol at home and had relief of the headache and fever.  Patient states he has been exposed to people with similar symptoms.  Patient denies being up-to-date on flu or COVID vaccination.  Patient denies any further past medical history including asthma COPD or smoking.  Patient denies chest pain or shortness of breath or productive/nonproductive cough.      Review of Systems   Pertinent positive and negative ROS as per HPI.  Historical Data   History Reviewed This Encounter: Medical History  Surgical History  Family History  Social History    Physical Exam   ED Triage Vitals [02/08/22 1304]   BP (Non-Invasive) 118/73   Heart Rate 57   Respiratory Rate 17   Temperature 36.7 C (98.1 F)   SpO2 100 %   Weight 69.4 kg (153 lb)   Height 1.905 m (6\' 3" )     Physical Exam  Vitals and nursing note reviewed.   Constitutional:       General: He is not in acute distress.     Appearance: He is well-developed.   HENT:      Head: Normocephalic and atraumatic.      Nose: Rhinorrhea present.      Mouth/Throat:      Pharynx: Posterior oropharyngeal erythema present.   Eyes:      Conjunctiva/sclera: Conjunctivae normal.   Cardiovascular:      Rate and Rhythm: Normal rate and regular rhythm.      Heart sounds: No murmur heard.  Pulmonary:      Effort: Pulmonary effort is normal. No respiratory distress.      Breath sounds: Normal breath sounds.   Abdominal:      Palpations: Abdomen is soft.      Tenderness:  There is no abdominal tenderness.   Musculoskeletal:         General: No swelling.      Cervical back: Neck supple.   Skin:     General: Skin is warm and dry.      Capillary Refill: Capillary refill takes less than 2 seconds.   Neurological:      Mental Status: He is alert.   Psychiatric:         Mood and Affect: Mood normal.       Patient Data     Labs Ordered/Reviewed   RAPID THROAT SCREEN, STREPTOCOCCUS, WITH REFLEX - Normal   COVID-19, FLU A/B, RSV RAPID BY PCR - Normal    Narrative:     Results are for the simultaneous qualitative identification of SARS-CoV-2 (formerly 2019-nCoV), Influenza A, Influenza B, and RSV RNA. These etiologic agents are generally detectable in nasopharyngeal and nasal swabs during the ACUTE PHASE of infection. Hence, this test is intended to be performed on respiratory specimens collected from individuals with signs and symptoms of upper respiratory tract infection who meet Centers for Disease Control and Prevention (CDC) clinical and/or epidemiological criteria for Coronavirus Disease 2019 (COVID-19) testing.  CDC COVID-19 criteria for testing on human specimens is available at [facility] webpage information for Healthcare Professionals: Coronavirus Disease 2019 (COVID-19) ([email or web address]).     False-negative results may occur if the virus has genomic mutations, insertions, deletions, or rearrangements or if performed very early in the course of illness. Otherwise, negative results indicate virus specific RNA targets are not detected, however negative results do not preclude SARS-CoV-2 infection/COVID-19, Influenza, or Respiratory syncytial virus infection. Results should not be used as the sole basis for patient management decisions. Negative results must be combined with clinical observations, patient history, and epidemiological information. If upper respiratory tract infection is still suspected based on exposure history together with other  clinical findings, re-testing should be considered.    Disclaimer:   This assay has been authorized by FDA under an Emergency Use Authorization for use in laboratories certified under the Clinical Laboratory Improvement Amendments of 1988 (CLIA), 42 U.S.C. [phone], to perform high complexity tests. The impacts of vaccines, antiviral therapeutics, antibiotics, chemotherapeutic or immunosuppressant drugs have not been evaluated.     Test methodology:   Cepheid Xpert Xpress SARS-CoV-2/Flu/RSV Assay real-time polymerase chain reaction (RT-PCR) test on the GeneXpert Dx and Xpert Xpress systems.   THROAT CULTURE, BETA HEMOLYTIC STREPTOCOCCUS     No orders to display     Medical Decision Making        Medical Decision Making  Patient 26 year old male to the emergency room complaining of rhinorrhea, congestion, headache and diffuse abdominal pain.  Differential diagnosis include but are not limited to COVID, flu, RSV, strep, viral syndrome.  On physical examination rhinorrhea noted with some erythema noted to oropharynx without exudate or hypertrophy.  Respirations equal nonlabored bilaterally skin is warm and dry.  Patient is afebrile.  Patient medicated with Motrin for headache and Mucinex.  COVID, flu, RSV and strep all result negative.  Patient diagnosed with viral syndrome and to continue Motrin and Tylenol as needed as well as Mucinex and antihistamine.  Patient discharged with diagnosed for viral syndrome and return to ED if worsening symptoms otherwise follow up with primary care provider.    Amount and/or Complexity of Data Reviewed  Labs: ordered.    Risk  OTC drugs.  Prescription drug management.             Medications Administered in the ED   guaiFENesin 100mg  per 29mL oral liquid - for cough (expectorant) (200 mg Oral Given 02/08/22 1334)   ibuprofen (MOTRIN) tablet (600 mg Oral Given 02/08/22 1334)     Clinical Impression   Viral syndrome (Primary)       Disposition: Discharged

## 2022-02-08 NOTE — ED Nurses Note (Signed)
Patient discharged home with family.  AVS reviewed with patient/care giver.  A written copy of the AVS and discharge instructions was given to the patient/care giver. Scripts handed to patient/care giver. Questions sufficiently answered as needed.  Patient/care giver encouraged to follow up with PCP as indicated.  In the event of an emergency, patient/care giver instructed to call 911 or go to the nearest emergency room.

## 2022-02-11 LAB — THROAT CULTURE, BETA HEMOLYTIC STREPTOCOCCUS: THROAT CULTURE: NORMAL

## 2022-02-13 ENCOUNTER — Other Ambulatory Visit (HOSPITAL_COMMUNITY): Payer: Self-pay | Admitting: NURSE PRACTITIONER

## 2022-02-13 MED ORDER — AMOXICILLIN 875 MG TABLET
875.0000 mg | ORAL_TABLET | Freq: Two times a day (BID) | ORAL | 0 refills | Status: DC
Start: 2022-02-13 — End: 2022-02-15

## 2022-02-15 ENCOUNTER — Emergency Department (EMERGENCY_DEPARTMENT_HOSPITAL): Payer: BC Managed Care – PPO

## 2022-02-15 ENCOUNTER — Other Ambulatory Visit: Payer: Self-pay

## 2022-02-15 ENCOUNTER — Encounter (HOSPITAL_BASED_OUTPATIENT_CLINIC_OR_DEPARTMENT_OTHER): Payer: Self-pay

## 2022-02-15 ENCOUNTER — Emergency Department
Admission: EM | Admit: 2022-02-15 | Discharge: 2022-02-15 | Disposition: A | Discharge status: Home / Self Care | Payer: BC Managed Care – PPO | Attending: Emergency Medicine | Admitting: Emergency Medicine

## 2022-02-15 DIAGNOSIS — M48061 Spinal stenosis, lumbar region without neurogenic claudication: Secondary | ICD-10-CM | POA: Diagnosis not present

## 2022-02-15 DIAGNOSIS — S39012A Strain of muscle, fascia and tendon of lower back, initial encounter: Secondary | ICD-10-CM | POA: Diagnosis not present

## 2022-02-15 DIAGNOSIS — M62838 Other muscle spasm: Secondary | ICD-10-CM | POA: Diagnosis not present

## 2022-02-15 DIAGNOSIS — M5137 Other intervertebral disc degeneration, lumbosacral region: Secondary | ICD-10-CM | POA: Diagnosis not present

## 2022-02-15 DIAGNOSIS — M545 Low back pain, unspecified: Secondary | ICD-10-CM

## 2022-02-15 DIAGNOSIS — X58XXXA Exposure to other specified factors, initial encounter: Secondary | ICD-10-CM | POA: Insufficient documentation

## 2022-02-15 DIAGNOSIS — X500XXA Overexertion from strenuous movement or load, initial encounter: Secondary | ICD-10-CM

## 2022-02-15 MED ORDER — IBUPROFEN 800 MG TABLET
800.0000 mg | ORAL_TABLET | Freq: Four times a day (QID) | ORAL | 0 refills | Status: DC | PRN
Start: 2022-02-15 — End: 2022-09-14

## 2022-02-15 MED ORDER — CYCLOBENZAPRINE 10 MG TABLET
ORAL_TABLET | ORAL | Status: AC
Start: 2022-02-15 — End: 2022-02-15
  Filled 2022-02-15: qty 1

## 2022-02-15 MED ORDER — IBUPROFEN 800 MG TABLET
ORAL_TABLET | ORAL | Status: AC
Start: 2022-02-15 — End: 2022-02-15
  Filled 2022-02-15: qty 1

## 2022-02-15 MED ORDER — CYCLOBENZAPRINE 10 MG TABLET
10.0000 mg | ORAL_TABLET | Freq: Three times a day (TID) | ORAL | 0 refills | Status: DC | PRN
Start: 2022-02-15 — End: 2022-09-14

## 2022-02-15 MED ORDER — METHYLPREDNISOLONE ACETATE 40 MG/ML SUSPENSION FOR INJECTION
80.0000 mg | Freq: Once | INTRAMUSCULAR | Status: AC
Start: 2022-02-15 — End: 2022-02-15
  Administered 2022-02-15: 80 mg via INTRAMUSCULAR

## 2022-02-15 MED ORDER — CYCLOBENZAPRINE 10 MG TABLET
10.0000 mg | ORAL_TABLET | ORAL | Status: AC
Start: 2022-02-15 — End: 2022-02-15
  Administered 2022-02-15: 10 mg via ORAL

## 2022-02-15 MED ORDER — TRAMADOL 50 MG TABLET
ORAL_TABLET | ORAL | Status: AC
Start: 2022-02-15 — End: 2022-02-15
  Filled 2022-02-15: qty 1

## 2022-02-15 MED ORDER — IBUPROFEN 800 MG TABLET
800.0000 mg | ORAL_TABLET | ORAL | Status: AC
Start: 2022-02-15 — End: 2022-02-15
  Administered 2022-02-15: 800 mg via ORAL

## 2022-02-15 MED ORDER — TRAMADOL 50 MG TABLET
1.0000 | ORAL_TABLET | Freq: Four times a day (QID) | ORAL | 0 refills | Status: AC | PRN
Start: 2022-02-15 — End: 2022-02-25

## 2022-02-15 MED ORDER — METHYLPREDNISOLONE ACETATE 40 MG/ML SUSPENSION FOR INJECTION
INTRAMUSCULAR | Status: AC
Start: 2022-02-15 — End: 2022-02-15
  Filled 2022-02-15: qty 2

## 2022-02-15 MED ORDER — TRAMADOL 50 MG TABLET
50.0000 mg | ORAL_TABLET | ORAL | Status: AC
Start: 2022-02-15 — End: 2022-02-15
  Administered 2022-02-15: 50 mg via ORAL

## 2022-02-15 NOTE — ED Provider Notes (Signed)
[facility], [facility] Emergency Department  ED Primary Provider Note  History of Present Illness   Chief Complaint   Patient presents with    Low Back Pain     Christian Huffman is a 26 y.o. male who had concerns including Low Back Pain.  Arrival: The patient arrived by Car complaining of lower back pain radiating into both buttocks.  Patient was lifting heavy metal box to grind it and felt a pop in his lower back.  Ever since then he has not been able to walk or stand upright without severe pain.  He stated the worse pain is when he bends forward or stands upright.  Patient denies any incontinence of urine or stool.  No numbness or tingling in the extremity.  No one-sided weakness.  Patient denies any hematuria.  No dysuria or increased frequency.    HPI  Review of Systems   Review of Systems   Constitutional:  Positive for activity change and appetite change. Negative for chills and fever.   HENT:  Negative for ear pain and sore throat.    Eyes:  Negative for pain and visual disturbance.   Respiratory:  Negative for cough and shortness of breath.    Cardiovascular:  Negative for chest pain and palpitations.   Gastrointestinal:  Negative for abdominal pain and vomiting.   Genitourinary:  Negative for dysuria and hematuria.   Musculoskeletal:  Positive for back pain, gait problem and myalgias. Negative for arthralgias.   Skin:  Negative for color change and rash.   Neurological:  Negative for seizures and syncope.   All other systems reviewed and are negative.     Historical Data   History Reviewed This Encounter:     Physical Exam   ED Triage Vitals [02/15/22 1327]   BP (Non-Invasive) 136/85   Heart Rate 99   Respiratory Rate 20   Temperature 36.7 C (98 F)   SpO2 100 %   Weight 69.4 kg (153 lb)   Height 1.905 m (6\' 3" )     Physical Exam  Vitals and nursing note reviewed.   Constitutional:       General: He is not in acute distress.     Appearance: He is well-developed and normal  weight.   HENT:      Head: Normocephalic and atraumatic.      Right Ear: External ear normal.      Left Ear: External ear normal.      Nose: Nose normal.      Mouth/Throat:      Mouth: Mucous membranes are moist.   Eyes:      Extraocular Movements: Extraocular movements intact.      Conjunctiva/sclera: Conjunctivae normal.      Pupils: Pupils are equal, round, and reactive to light.   Cardiovascular:      Rate and Rhythm: Normal rate and regular rhythm.      Pulses: Normal pulses.      Heart sounds: Normal heart sounds. No murmur heard.  Pulmonary:      Effort: Pulmonary effort is normal. No respiratory distress.      Breath sounds: Normal breath sounds.   Abdominal:      General: Bowel sounds are normal.      Palpations: Abdomen is soft.      Tenderness: There is no abdominal tenderness.   Musculoskeletal:         General: Tenderness present. No swelling.      Cervical back: Normal range  of motion and neck supple.      Comments: Positive tenderness L4-L5.  Positive tenderness bilateral sciatic notches.   Skin:     General: Skin is warm and dry.      Capillary Refill: Capillary refill takes less than 2 seconds.   Neurological:      General: No focal deficit present.      Mental Status: He is alert and oriented to person, place, and time.   Psychiatric:         Mood and Affect: Mood normal.         Behavior: Behavior normal.         Thought Content: Thought content normal.       Patient Data   Labs Ordered/Reviewed - No data to display  XR LUMBAR SPINE AP AND LAT   Preliminary Result by Edi, Radresults In (10/02 1403)   No acute lumbar spine abnormality demonstrated      Moderate L5-S1 disc space narrowing.                  Radiologist location ID: [id number] Making        Medical Decision Making  Patient is 26 year old white male complaining lower back pain  after lifting heavy metal box while at work this morning.  Patient states ever since he has been unable to stand upright or bent  without severe pain in his lower back.  He denies any incontinence urine or stool.  No numbness or tingling in the extremities.  No weakness.  Patient will get an x-ray of his lower and then be given analgesia as well as steroid injection.  Patient will also receive a muscle relaxant for the pain.  Patient will be given an orthopedic to follow up with in the next 2-3 days.    Amount and/or Complexity of Data Reviewed  Radiology: ordered.    Risk  Prescription drug management.             Medications Administered in the ED   ibuprofen (MOTRIN) tablet (has no administration in time range)   cyclobenzaprine (FLEXERIL) tablet (has no administration in time range)   methylPREDNISolone acetate (DEPO-medrol) 40 mg/mL injection (has no administration in time range)   traMADol (ULTRAM) tablet (has no administration in time range)     Clinical Impression   Lumbosacral strain, initial encounter (Primary)   Muscle spasm   DDD (degenerative disc disease), lumbosacral       Disposition: Discharged               Clinical Impression   Lumbosacral strain, initial encounter (Primary)   Muscle spasm   DDD (degenerative disc disease), lumbosacral       Current Discharge Medication List        START taking these medications    Details   cyclobenzaprine (FLEXERIL) 10 mg Oral Tablet Take 1 Tablet (10 mg total) by mouth Three times a day as needed for Muscle spasms  Qty: 12 Tablet, Refills: 0      Ibuprofen (MOTRIN) 800 mg Oral Tablet Take 1 Tablet (800 mg total) by mouth Four times a day as needed for Pain  Qty: 30 Tablet, Refills: 0      traMADoL (ULTRAM) 50 mg Oral Tablet Take 1 Tablet (50 mg total) by mouth Every 6 hours as needed for Pain for up to 10 days  Qty: 20 Tablet, Refills: 0

## 2022-02-15 NOTE — ED Triage Notes (Signed)
Patient c/o lower back pain since 0645 this am, states he was lifting a box at work and felt a "pop in his spine"

## 2022-04-16 DIAGNOSIS — Z419 Encounter for procedure for purposes other than remedying health state, unspecified: Secondary | ICD-10-CM | POA: Diagnosis not present

## 2022-05-17 DIAGNOSIS — Z419 Encounter for procedure for purposes other than remedying health state, unspecified: Secondary | ICD-10-CM | POA: Diagnosis not present

## 2022-06-17 DIAGNOSIS — Z419 Encounter for procedure for purposes other than remedying health state, unspecified: Secondary | ICD-10-CM | POA: Diagnosis not present

## 2022-07-16 DIAGNOSIS — Z419 Encounter for procedure for purposes other than remedying health state, unspecified: Secondary | ICD-10-CM | POA: Diagnosis not present

## 2022-08-16 DIAGNOSIS — Z419 Encounter for procedure for purposes other than remedying health state, unspecified: Secondary | ICD-10-CM | POA: Diagnosis not present

## 2022-09-14 ENCOUNTER — Other Ambulatory Visit: Payer: Self-pay

## 2022-09-14 ENCOUNTER — Encounter (HOSPITAL_BASED_OUTPATIENT_CLINIC_OR_DEPARTMENT_OTHER): Payer: Self-pay

## 2022-09-14 ENCOUNTER — Emergency Department
Admission: EM | Admit: 2022-09-14 | Discharge: 2022-09-14 | Disposition: A | Discharge status: Home / Self Care | Payer: Self-pay | Attending: Emergency Medicine | Admitting: Emergency Medicine

## 2022-09-14 DIAGNOSIS — Z113 Encounter for screening for infections with a predominantly sexual mode of transmission: Secondary | ICD-10-CM | POA: Insufficient documentation

## 2022-09-14 DIAGNOSIS — Z202 Contact with and (suspected) exposure to infections with a predominantly sexual mode of transmission: Secondary | ICD-10-CM

## 2022-09-14 HISTORY — DX: Bipolar disorder, unspecified: F31.9

## 2022-09-14 HISTORY — DX: Attention-deficit hyperactivity disorder, unspecified type: F90.9

## 2022-09-14 LAB — URINALYSIS, MACRO/MICRO
BILIRUBIN: NEGATIVE mg/dL
BLOOD: NEGATIVE mg/dL
GLUCOSE: NEGATIVE mg/dL
KETONES: NEGATIVE mg/dL
LEUKOCYTES: NEGATIVE WBCs/uL
NITRITE: NEGATIVE
PH: 7 (ref 4.6–8.0)
PROTEIN: NEGATIVE mg/dL
SPECIFIC GRAVITY: 1.02 (ref 1.003–1.035)
UROBILINOGEN: 1 mg/dL (ref 0.2–1.0)

## 2022-09-14 MED ORDER — LIDOCAINE HCL 10 MG/ML (1 %) INJECTION SOLUTION
500.0000 mg | Freq: Once | INTRAMUSCULAR | Status: AC
Start: 2022-09-14 — End: 2022-09-14
  Administered 2022-09-14: 500 mg via INTRAMUSCULAR

## 2022-09-14 MED ORDER — DOXYCYCLINE HYCLATE 100 MG CAPSULE
100.0000 mg | ORAL_CAPSULE | Freq: Two times a day (BID) | ORAL | 0 refills | Status: DC
Start: 2022-09-14 — End: 2022-10-18

## 2022-09-14 MED ORDER — CEFTRIAXONE 500 MG SOLUTION FOR INJECTION
INTRAMUSCULAR | Status: AC
Start: 2022-09-14 — End: 2022-09-14
  Filled 2022-09-14: qty 5

## 2022-09-14 MED ORDER — LIDOCAINE HCL 10 MG/ML (1 %) INJECTION SOLUTION
INTRAMUSCULAR | Status: AC
Start: 2022-09-14 — End: 2022-09-14
  Filled 2022-09-14: qty 20

## 2022-09-14 MED ORDER — DOXYCYCLINE HYCLATE 100 MG TABLET
ORAL_TABLET | ORAL | Status: AC
Start: 2022-09-14 — End: 2022-09-14
  Filled 2022-09-14: qty 1

## 2022-09-14 MED ORDER — AZITHROMYCIN 250 MG TABLET
1000.0000 mg | ORAL_TABLET | ORAL | Status: AC
Start: 2022-09-14 — End: 2022-09-14
  Administered 2022-09-14: 1000 mg via ORAL

## 2022-09-14 MED ORDER — AZITHROMYCIN 250 MG TABLET
ORAL_TABLET | ORAL | Status: AC
Start: 2022-09-14 — End: 2022-09-14
  Filled 2022-09-14: qty 4

## 2022-09-14 MED ORDER — DOXYCYCLINE HYCLATE 100 MG TABLET
100.0000 mg | ORAL_TABLET | ORAL | Status: AC
Start: 2022-09-14 — End: 2022-09-14
  Administered 2022-09-14: 100 mg via ORAL

## 2022-09-14 NOTE — ED Provider Notes (Signed)
[facility], [facility] Emergency Department  ED Primary Provider Note  History of Present Illness   Chief Complaint   Patient presents with    Exposure to STD            Christian Huffman is a 27 y.o. male who had concerns including Exposure to STD.  Arrival: The patient arrived by Car complaining being with 2 different women and does not know whether or not he contracted something from either 1.  He denies any discharge or sores on his penis.  He denies any dysuria or increased frequency.  No fever chills.  No pain in his groin area.  No open sore.  He stated the previous girlfriend had bleeding last time they were together.  He denies any discharge.  She was going to get checked by her gyn this week.    HPI  Review of Systems   Review of Systems   Constitutional:  Negative for chills and fever.   HENT:  Negative for ear pain and sore throat.    Eyes:  Negative for pain and visual disturbance.   Respiratory:  Negative for cough and shortness of breath.    Cardiovascular:  Negative for chest pain and palpitations.   Gastrointestinal:  Negative for abdominal pain and vomiting.   Genitourinary:  Negative for dysuria and hematuria.   Musculoskeletal:  Negative for arthralgias and back pain.   Skin:  Negative for color change and rash.   Neurological:  Negative for seizures and syncope.   All other systems reviewed and are negative.     Historical Data   History Reviewed This Encounter:     Physical Exam   ED Triage Vitals [09/14/22 1639]   BP (Non-Invasive) 135/84   Heart Rate 95   Respiratory Rate 16   Temperature 36.4 C (97.5 F)   SpO2 99 %   Weight 72.6 kg (160 lb)   Height 1.905 m (6\' 3" )     Physical Exam  Vitals and nursing note reviewed.   Constitutional:       General: He is not in acute distress.     Appearance: He is well-developed and normal weight.   HENT:      Head: Normocephalic and atraumatic.      Right Ear: External ear normal.      Left Ear: External ear normal.       Nose: Nose normal.      Mouth/Throat:      Mouth: Mucous membranes are dry.   Eyes:      Extraocular Movements: Extraocular movements intact.      Conjunctiva/sclera: Conjunctivae normal.      Pupils: Pupils are equal, round, and reactive to light.   Cardiovascular:      Rate and Rhythm: Normal rate and regular rhythm.      Pulses: Normal pulses.      Heart sounds: Normal heart sounds. No murmur heard.  Pulmonary:      Effort: Pulmonary effort is normal. No respiratory distress.      Breath sounds: Normal breath sounds.   Abdominal:      General: Bowel sounds are normal.      Palpations: Abdomen is soft.      Tenderness: There is no abdominal tenderness.   Genitourinary:     Penis: Normal.    Musculoskeletal:         General: No swelling. Normal range of motion.      Cervical back: Normal range of  motion and neck supple.   Skin:     General: Skin is warm and dry.      Capillary Refill: Capillary refill takes less than 2 seconds.   Neurological:      General: No focal deficit present.      Mental Status: He is alert and oriented to person, place, and time.   Psychiatric:         Mood and Affect: Mood normal.         Behavior: Behavior normal.         Thought Content: Thought content normal.       Patient Data     Labs Ordered/Reviewed   URINALYSIS, MACRO/MICRO - Abnormal; Notable for the following components:       Result Value    APPEARANCE Slightly Hazy (*)     All other components within normal limits   URINALYSIS WITH REFLEX MICROSCOPIC AND CULTURE IF POSITIVE    Narrative:     The following orders were created for panel order URINALYSIS WITH REFLEX MICROSCOPIC AND CULTURE IF POSITIVE.  Procedure                               Abnormality         Status                     ---------                               -----------         ------                     URINALYSIS, MACRO/MICRO[[id number]]      Abnormal            Final result                 Please view results for these tests on the individual orders.    CHLAMYDIA / NEISSERIA DNA BY PCR     No orders to display     Medical Decision Making        Medical Decision Making  Patient is 27 year old white male complaining wanting to get checked for an STD.  Patient states the last early was with had bleeding afterwards.  She denies any discharge.  He denies any discharge or open sore on his penis.  Denies any scrotal pain.  No dysuria or increased frequency.  Patient states he was with another woman before her who also denied any drainage or discharge.  Patient will have a urinalysis done along with GC chlamydia culture.  Patient will be treated for questionable exposure to an STD.  Patient will then be discharged on doxycycline.  Patient will follow up with his PMD in the next 3-4 days.    Risk  Prescription drug management.             Medications Administered in the ED   cefTRIAXone (ROCEPHIN) 500 mg in lidocaine 1.43 mL (tot vol) IM injection (has no administration in time range)   doxycycline tablet (has no administration in time range)   azithromycin (ZITHROMAX) tablet (has no administration in time range)     Clinical Impression   Exposure to STD (Primary)       Disposition: Discharged  Clinical Impression   Exposure to STD (Primary)       Current Discharge Medication List        START taking these medications    Details   doxycycline hyclate (VIBRAMYCIN) 100 mg Oral Capsule Take 1 Capsule (100 mg total) by mouth Twice daily for 10 days  Qty: 20 Capsule, Refills: 0

## 2022-09-14 NOTE — ED Nurses Note (Signed)
Patient given written and verbal d/c instructions. All questions/concerns addressed. Informed to return with any concerns. Verbalizes understanding of d/c instructions. Informed of x1 prescription at pharmacy. Patient leaving ambulatory at this time.

## 2022-09-14 NOTE — ED Nurses Note (Signed)
Patient reports possible STD exposure. Reports 2 sexual partners, denies any drainage/sores on penile area. Patient denies any urinary symptoms. Denies any pain.

## 2022-09-14 NOTE — ED Triage Notes (Signed)
Patient reports needs STD testing. Patient states he was informed by a sexual partner she "was not feeling right." Patient states the sexual partner has scheduled an appointment to be STD tested, but has not gone yet.

## 2022-09-15 ENCOUNTER — Telehealth (HOSPITAL_BASED_OUTPATIENT_CLINIC_OR_DEPARTMENT_OTHER): Payer: Self-pay

## 2022-09-15 DIAGNOSIS — Z419 Encounter for procedure for purposes other than remedying health state, unspecified: Secondary | ICD-10-CM | POA: Diagnosis not present

## 2022-09-15 LAB — CHLAMYDIA / NEISSERIA DNA BY PCR
CHLAMYDIA TRACHOMATIS PCR: NOT DETECTED
NEISSERIA GONORRHOEAE PCR: NOT DETECTED

## 2022-09-15 NOTE — ED Nurses Note (Signed)
No phone number listed for patient. No follow-up

## 2022-10-16 DIAGNOSIS — Z419 Encounter for procedure for purposes other than remedying health state, unspecified: Secondary | ICD-10-CM | POA: Diagnosis not present

## 2022-10-18 ENCOUNTER — Other Ambulatory Visit: Payer: Self-pay

## 2022-10-18 ENCOUNTER — Emergency Department
Admission: EM | Admit: 2022-10-18 | Discharge: 2022-10-18 | Disposition: A | Discharge status: Home / Self Care | Payer: Worker's Compensation | Attending: Emergency Medicine | Admitting: Emergency Medicine

## 2022-10-18 ENCOUNTER — Emergency Department (HOSPITAL_COMMUNITY): Payer: Worker's Compensation

## 2022-10-18 ENCOUNTER — Inpatient Hospital Stay (HOSPITAL_COMMUNITY): Payer: Worker's Compensation

## 2022-10-18 DIAGNOSIS — S32591A Other specified fracture of right pubis, initial encounter for closed fracture: Secondary | ICD-10-CM | POA: Insufficient documentation

## 2022-10-18 DIAGNOSIS — W11XXXA Fall on and from ladder, initial encounter: Secondary | ICD-10-CM | POA: Insufficient documentation

## 2022-10-18 DIAGNOSIS — W19XXXA Unspecified fall, initial encounter: Secondary | ICD-10-CM

## 2022-10-18 MED ORDER — HYDROMORPHONE 2 MG/ML INJECTION WRAPPER
INJECTION | INTRAMUSCULAR | Status: AC
Start: 2022-10-18 — End: 2022-10-18
  Filled 2022-10-18: qty 1

## 2022-10-18 MED ORDER — HYDROMORPHONE 2 MG/ML INJECTION WRAPPER
1.0000 mg | INJECTION | INTRAMUSCULAR | Status: AC
Start: 2022-10-18 — End: 2022-10-18
  Administered 2022-10-18: 1 mg via INTRAVENOUS

## 2022-10-18 MED ORDER — IOHEXOL 350 MG IODINE/ML INTRAVENOUS SOLUTION
100.0000 mL | INTRAVENOUS | Status: AC
Start: 2022-10-18 — End: 2022-10-18
  Administered 2022-10-18: 100 mL via INTRAVENOUS

## 2022-10-18 MED ORDER — TRAMADOL 50 MG TABLET
2.0000 | ORAL_TABLET | Freq: Four times a day (QID) | ORAL | 0 refills | Status: AC | PRN
Start: 2022-10-18 — End: 2022-10-28

## 2022-10-18 MED ORDER — FENTANYL (PF) 50 MCG/ML INJECTION SOLUTION
INTRAMUSCULAR | Status: AC
Start: 2022-10-18 — End: 2022-10-18
  Filled 2022-10-18: qty 2

## 2022-10-18 MED ORDER — KETOROLAC 60 MG/2 ML INTRAMUSCULAR SOLUTION
INTRAMUSCULAR | Status: AC
Start: 2022-10-18 — End: 2022-10-18
  Filled 2022-10-18: qty 2

## 2022-10-18 MED ORDER — FENTANYL (PF) 50 MCG/ML INJECTION SOLUTION
100.0000 ug | INTRAMUSCULAR | Status: AC
Start: 2022-10-18 — End: 2022-10-18
  Administered 2022-10-18: 100 ug via INTRAVENOUS

## 2022-10-18 MED ORDER — KETOROLAC 60 MG/2 ML INTRAMUSCULAR SOLUTION
60.0000 mg | INTRAMUSCULAR | Status: AC
Start: 2022-10-18 — End: 2022-10-18
  Administered 2022-10-18: 60 mg via INTRAMUSCULAR

## 2022-10-18 MED ORDER — DICLOFENAC SODIUM 75 MG TABLET,DELAYED RELEASE: 75.0000 mg | DELAYED_RELEASE_TABLET | Freq: Two times a day (BID) | ORAL | 0 refills | Status: DC

## 2022-10-18 NOTE — ED Nurses Note (Signed)
Patient discharged home with family.  AVS reviewed with patient/care giver.  A written copy of the AVS and discharge instructions was given to the patient/care giver.  Questions sufficiently answered as needed.  Patient/care giver encouraged to follow up with PCP as indicated.  In the event of an emergency, patient/care giver instructed to call 911 or go to the nearest emergency room.   Crutches provided. Patient to follow up with Orthopedic surgery

## 2022-10-18 NOTE — Discharge Instructions (Signed)
THE PATIENT IS TO FOLLOW UP WITH THE PRIMARY CARE PROVIDER AS SOON AS POSSIBLE BUT NO LATER THAN 3 DAYS FROM LEAVING THE EMERGENCY DEPARTMENT.  IF NO PRIMARY CARE PROVIDER EXISTS, THEN THE PATIENT IS INSTRUCTED TO ESTABLISH CARE WITH A PRIMARY CARE PROVIDER AS SOON AS POSSIBLE BUT NO LATER THAN 3 DAYS FROM LEAVING THE EMERGENCY DEPARTMENT.  FOLLOW-UP WITH ANY SPECIALIST PROVIDER AS INDICATED AS SOON AS POSSIBLE BUT NO LATER THAN 3 DAYS, IF APPLICABLE.  NOTIFY THE PRIMARY CARE PROVIDER THAT YOU WERE IN THE EMERGENCY DEPARTMENT WITHIN 24 HOURS OF DISCHARGE TO FOLLOW-UP ON YOUR RESULTS AND/OR TREATMENTS.  RETURN TO THE EMERGENCY DEPARTMENT IMMEDIATELY IF NEEDED, NO BETTER, WORSE, NEW SYMPTOMS ARISE, OR YOU CANNOT FOLLOW-UP WITH YOUR PRIMARY CARE PROVIDER AND/OR APPLICABLE SPECIALIST IN THE PRESCRIBED TIMEFRAME.      WEIGHTBEAR AS TOLERATED.  FOLLOW UP WITH ORTHO.

## 2022-10-18 NOTE — ED APP Handoff Note (Signed)
[facility]  Emergency Department  Provider in Triage Note    Name: Jontay Cornwell  Age: 27 y.o.  Gender: male     Subjective:   Jase Volkov is a 27 y.o. male who presents with complaint of Hip Pain  .  Pt presents with complaints of right hip pain after falling off a ladder 3 hours ago. He fell approximately 3 ft onto right hip. Denies head injury. Denies pain in UE. Denies numbness or paraesthesia to LE. He was seen at [facility] and was told his hip was fractured.     Objective:   Filed Vitals:    10/18/22 1147   BP: 138/85   Pulse: 65   Resp: 20   Temp: 36.9 C (98.4 F)   SpO2: 98%      Focused Physical Exam shows adult male in [facility] unable to bear weight. No point tenderness in lumbar spine, right knee or lower leg. R LE PT pulse 2+.     Assessment:  A medical screening exam was completed.  This patient is a 27 y.o. male with initial findings showing concern for hip fracture    Plan:  Please see initial orders and work-up below.  This is to be continued with full evaluation in the main Emergency Department.     No current facility-administered medications for this encounter.     No results found for this or any previous visit (from the past 24 hour(s)).     Senaida Lange, PA-C  10/18/2022, 11:50

## 2022-10-18 NOTE — ED Triage Notes (Signed)
Sent by [facility] for rt hip fx per pt . Fell from ladder apporx 3 feet at work .

## 2022-10-18 NOTE — ED Provider Notes (Signed)
[facility]  Emergency Department  Attending Provider Note      CHIEF COMPLAINT  Chief Complaint   Patient presents with    Hip Pain     HISTORY OF PRESENT ILLNESS  Christian Huffman, date of birth 08/21/1995, is a 27 y.o. male who presented to the Emergency Department.    PATIENT REPORTS HAVING FALLEN PRIOR TO ARRIVAL PAIN IN THE RIGHT HIP REGION.  HE FELL ABOUT 3 FT RIGHT ONTO HIS RIGHT HIP.  SYMPTOMS WORSE WITH ACTIVITY BETTER WITH REST AND DESCRIBED AS MODERATE GENERALLY IN NATURE.  NO BLADDER OR BOWEL DYSFUNCTION.  NO FEVERS CHILLS CHEST PAIN PALPITATION SHORTNESS OF BREATH COUGH DYSURIA HEMATURIA NAUSEA VOMITING.  NO LOSS OF CONSCIOUSNESS OR HEAD/NECK TRAUMA.  COMPREHENSIVE 10+ REVIEW OF SYSTEMS IS OTHERWISE NEGATIVE.  THERE ARE NO OTHER ACUTE COMPLAINTS REPORTED BY THE PATIENT.    PAST MEDICAL/SURGICAL/FAMILY/SOCIAL HISTORY  Past Medical History:   Diagnosis Date    ADHD (attention deficit hyperactivity disorder)     Bipolar disorder, unspecified (CMS HCC)     Compression fracture of C3 vertebra with routine healing        Past Surgical History:   Procedure Laterality Date    GALLBLADDER SURGERY         Family Medical History:    None       Social History     Socioeconomic History    Marital status: Single   Tobacco Use    Smoking status: Every Day     Current packs/day: 1.50     Average packs/day: 1.5 packs/day for 15.0 years (22.5 ttl pk-yrs)     Types: Cigarettes    Smokeless tobacco: Never   Vaping Use    Vaping status: Former    Substances: Nicotine, THC, Flavoring   Substance and Sexual Activity    Alcohol use: Yes     Comment: Socially    Drug use: Never      ALLERGIES  Allergies   Allergen Reactions    Haldol [Haloperidol]  Other Adverse Reaction (Add comment)     Dropped BP and hr       PHYSICAL EXAM  VITAL SIGNS:  Filed Vitals:    10/18/22 1415 10/18/22 1430 10/18/22 1445 10/18/22 1500   BP:   (!) 128/96 137/84   Pulse: 51 60 64 (!) 49   Resp: 14 18 12  (!) 11   Temp:       SpO2: 99%  98% 100% 98%     GENERAL: PATIENT IS ALERT AND ORIENTED TO PERSON, PLACE, AND TIME.  HEAD: NORMOCEPHALIC AND ATRAUMATIC.  EYES: PUPILS EQUALLY ROUND AND REACT TO LIGHT. EXTRAOCULAR MOVEMENTS INTACT.  EARS: GROSS HEARING INTACT. EXTERNAL EARS WITHIN NORMAL LIMITS.  NOSE: NO SEPTAL DEVIATION. NASAL PASSAGES CLEAR.  THROAT: MOIST ORAL MUCOSA.   NECK: SUPPLE. TRACHEA MIDLINE.  HEART: REGULAR, RATE, AND RHYTHM.  LUNGS: CLEAR TO AUSCULTATION BILATERAL.  ABDOMEN: SOFT, NON-TENDER, NON-DISTENDED, AND BOWEL SOUNDS ARE PRESENT.  GENITOURINARY: DEFERRED.  RECTAL: DEFERRED.  EXTREMITIES: NO CYANOSIS, CLUBBING, OR EDEMA.  SKIN: WARM AND DRY.  MUSCULOSKELETAL:  PAIN TO PALPATION AROUND THE RIGHT HIP/GROIN REGION.  NEUROLOGIC: CRANIAL NERVES II THROUGH XII ARE GROSSLY INTACT. SENSATION TO LIGHT TOUCH IS INTACT.  PSYCHIATRIC: JUDGMENT AND INSIGHT ARE SEEMINGLY INTACT. MOOD AND AFFECT ARE APPROPRIATE FOR THE SITUATION.    DIAGNOSTICS  Labs:  Labs listed below were reviewed and interpreted by me.  No results found for any visits on 10/18/22.  Radiology:  Results for orders  placed or performed during the hospital encounter of 10/18/22   XR LUMBAR SPINE AP AND LAT     Status: None    Narrative    Isahia Fernando    RADIOLOGIST: Alvester Chou, MD    XR LUMBAR SPINE AP AND LAT performed on 10/18/2022 12:23 PM    CLINICAL HISTORY: fal off ladder; right hip fracture per [facility].  fall from ladder    TECHNIQUE:  2 views of the lumbar spine.    COMPARISON: 02/15/2022    FINDINGS:  Normal lumbar vertebral heights.  No evidence of fracture.  Disc space heights are preserved.  Normal alignment.  No spondylolisthesis.        Impression    NEGATIVE LUMBAR SPINE.          Radiologist location ID: [id number]     XR FEMUR RIGHT     Status: None    Narrative    Nunzio Oriordan    RADIOLOGIST: Alvester Chou, MD    XR FEMUR RIGHT- 2 VIEWS performed on 10/18/2022 12:23 PM    CLINICAL HISTORY: fall off ladder; right hip fracture per [facility].  fall  from ladder    TECHNIQUE:  2 views of the right femur.    COMPARISON:  None.    FINDINGS:   No fracture seen in the right femoral shaft. There is questionable right pubic bone fracture. The right hip is not adequately evaluated on this exam and if there is concern for a right hip fracture, consider further evaluation with a dedicated right hip x-ray series or CT scan of  No dislocation at the right knee or hip  Soft tissues are unremarkable.        Impression    NO DEFINITE FRACTURE SEEN IN THE RIGHT FEMUR ALTHOUGH THE HIP IS NOT OPTIMALLY EVALUATED. QUESTIONABLE FRACTURE OF THE RIGHT PUBIC BONE WHICH IS NOT COMPLETELY INCLUDED ON THIS EXAM. CONSIDER DEDICATED RIGHT HIP X-RAY SERIES OR CT SCAN OF THE RIGHT HIP.         Radiologist location ID: [id number]     CT PELVIS WO IV CONTRAST     Status: None    Narrative    Shahrukh Marcoe    RADIOLOGIST: Karolee Stamps    CT PELVIS WO IV CONTRAST performed on 10/18/2022 12:25 PM    CLINICAL HISTORY: fall from ladder; fracture of right inferior pubic ramus on x-ray from [facility].  fall from ladder    TECHNIQUE:  Pelvis CT without contrast.    COMPARISON: None.    FINDINGS:  Bones: Minimally displaced fractures of the anterior aspect of the right inferior pubic ramus present. No displaced fracture of the right superior pubic ramus is identified. An obliquely oriented linear lucency involving the posterior and medial aspect of the right superior pubic ramus present. No pubic symphysis or sacroiliac joint diastases is identified. No fracture of the proximal right femur or right acetabulum. No sacral fracture is identified.    Soft Tissues: No large hematoma. No dependent layering free fluid within the pelvis is seen. The urinary bladder is distended. A normal appendix is noted.        Impression    1. Minimally displaced fractures of the anterior aspect of the right inferior pubic ramus.  2. Nondisplaced fracture of the posterior aspect of the right superior pubic ramus  versus vascular channel.      One or more dose reduction techniques were used (e.g., Automated exposure control, adjustment of the mA and/or kV according  to patient size, use of iterative reconstruction technique).      Radiologist location ID: [id number]     CT CHEST ABDOMEN PELVIS W IV CONTRAST     Status: None    Narrative    Tamarcus Pennie    RADIOLOGIST: Alvester Chou, MD    CT CHEST ABDOMEN PELVIS W IV CONTRAST performed on 10/18/2022 2:20 PM    CLINICAL HISTORY: fall.  fall, hip pain    TECHNIQUE:  Chest, abdomen and pelvis CT with intravenous contrast.  CONTRAST:  75 ml's of Omnipaque 350    COMPARISON:  None.    FINDINGS:  CT CHEST:  Hardware:  None.    Lymph nodes:   No mediastinal, hilar, or axillary lymphadenopathy.    Heart and Vasculature:  Normal heart size.  No pericardial effusion. Thoracic aorta and pulmonary arteries are unremarkable.      Lungs and Airways:  Linear density left lung base could be due to atelectasis.    Pleura: No pleural effusion.  No pneumothorax.    Bones: Bone windows are unremarkable.      CT ABDOMEN/PELVIS:  Liver:   Unremarkable.    Gallbladder:   Surgically absent.    Spleen:   Unremarkable.    Pancreas:   Unremarkable.    Adrenals:   Unremarkable.    Kidneys:   Unremarkable.    Bladder:  Unremarkable.    Prostate:  Unremarkable.    Bowel:   Unremarkable.    Appendix:  Normal.    Lymph nodes:  No suspicious lymph node enlargement.    Vasculature:   Major vascular structures are unremarkable.     Peritoneum / Retroperitoneum: No ascites.  No free air.    Bones:   Fracture right inferior pubic ramus.          Impression    1. ACUTE FRACTURE RIGHT INFERIOR PUBIC RAMUS  2. NO OTHER ACUTE FINDINGS       One or more dose reduction techniques were used (e.g., Automated exposure control, adjustment of the mA and/or kV according to patient size, use of iterative reconstruction technique).      Radiologist location ID: [id number]         ED COURSE/MEDICAL DECISION  MAKING  Medications Administered in the ED   ketorolac (TORADOL) 60mg /2 mL IM injection (60 mg IntraMUSCULAR Given 10/18/22 1153)   fentaNYL (SUBLIMAZE) 50 mcg/mL injection (100 mcg Intravenous Given 10/18/22 1314)   iohexol (OMNIPAQUE 350) infusion (100 mL Intravenous Given 10/18/22 1421)   HYDROmorphone (DILAUDID) 2 mg/mL injection (1 mg Intravenous Given 10/18/22 1443)      ED Course as of 10/18/22 1636   Mon Oct 18, 2022   1447 XR FEMUR RIGHT  TODAY I, Tyia Binford A. Hennessey Cantrell, D.O., PERSONALLY VISUALIZED THE PATIENT'S DIAGNOSTIC IMAGING STUDIES.  DEFER TO THE RADIOLOGIST'S REPORT FOR THE FINAL DEFINITIVE RADIOLOGY READING.  MY INITIAL INDEPENDENT INTERPRETATION IS AS FOLLOWS, BUT NOT LIMITED TO:  NO OBVIOUS FRACTURE OR FEMUR        Medical Decision Making  SPOKE WITH ORTHO WHO RECOMMENDED WEIGHT-BEARING AS TOLERATED.  THEY WILL FOLLOW UP IN THE OFFICE.    Problems Addressed:  Closed fracture of right inferior pubic ramus, initial encounter (CMS [facility]): acute illness or injury  Fall: acute illness or injury    Amount and/or Complexity of Data Reviewed  Radiology: ordered and independent interpretation performed. Decision-making details documented in ED Course.    Risk  Prescription drug management.  Parenteral controlled substances.  Diagnosis  or treatment significantly limited by social determinants of health.      CLINICAL IMPRESSION  Clinical Impression   Closed fracture of right inferior pubic ramus, initial encounter (CMS HCC) (Primary)   Fall     DISPOSITION  Discharged       DISCHARGE MEDICATIONS  Discharge Medication List as of 10/18/2022  2:57 PM        START taking these medications    Details   diclofenac sodium (VOLTAREN) 75 mg Oral Tablet, Delayed Release (E.C.) Take 1 Tablet (75 mg total) by mouth Twice daily, Disp-30 Tablet, R-0, Print      traMADoL (ULTRAM) 50 mg Oral Tablet Take 2 Tablets (100 mg total) by mouth Every 6 hours as needed for Pain for up to 10 days, Disp-80 Tablet, R-0, Print             /Pinky Ravan A.  Earlene Plater, DO, MBA   10/18/2022, 16:34   [facility]  Department of Emergency Medicine  [facility]    This note was partially generated using MModal Fluency Direct system, and there may be some incorrect words, spellings, and punctuation that were not noted in checking the note before saving.

## 2022-10-18 NOTE — ED Nurses Note (Signed)
Patient to room 26 c/o right hip pain. Patient states he cannot put weight on right leg. Was sent from [facility] for further eval/.

## 2022-11-15 DIAGNOSIS — Z419 Encounter for procedure for purposes other than remedying health state, unspecified: Secondary | ICD-10-CM | POA: Diagnosis not present

## 2022-12-16 DIAGNOSIS — Z419 Encounter for procedure for purposes other than remedying health state, unspecified: Secondary | ICD-10-CM | POA: Diagnosis not present

## 2023-01-16 DIAGNOSIS — Z419 Encounter for procedure for purposes other than remedying health state, unspecified: Secondary | ICD-10-CM | POA: Diagnosis not present

## 2023-02-15 DIAGNOSIS — Z419 Encounter for procedure for purposes other than remedying health state, unspecified: Secondary | ICD-10-CM | POA: Diagnosis not present

## 2023-03-18 DIAGNOSIS — Z419 Encounter for procedure for purposes other than remedying health state, unspecified: Secondary | ICD-10-CM | POA: Diagnosis not present

## 2023-03-27 NOTE — ED Provider Notes (Signed)
ED Provider Note    Subjective     HPI    Chief Complaint   Patient presents with   . Pelvic Pain   . Leg Pain   . MEDICAL PROBLEM     Right foot numbness     Christian Huffman is a 27 y.o. male who presents with right pelvic pain over the past few weeks.  The patient fell 3 feet in June and sustained a fracture of pubic rami but have been ambulating well since then until he is increasing pain over the past 3 weeks.    Social History:  Social History     Tobacco Use   . Smoking status: Never   . Smokeless tobacco: Never   Substance Use Topics   . Alcohol use: Never   . Drug use: Never      Family History: As documented.  No past medical history on file.  Past Surgical History:   Procedure Laterality Date   . HX CHOLECYSTECTOMY         Objective     Physical Exam    Vitals:  Vitals:    03/27/23 1952   BP: (!) 145/88   Patient Position: Sitting   Pulse: 64   Resp: 18   Temp: 98.4 F (36.9 C)   SpO2: 99%   Weight: 77.1 kg (170 lb)   Height: 1.905 m (6\' 3" )     ED Triage Vitals:  ED Triage Vitals [03/27/23 1952]   BP Pulse Resp Temp SpO2 Flow (L/min) (Oxygen Therapy)   (!) 145/88 64 18 98.4 F (36.9 C) 99 % --       Vital signs reviewed, nursing notes reviewed.  Constitutional: well-developed, no acute distress  Eyes: No conjunctival injection  ENT: Atraumatic ears/nose, oropharynx clear and moist  Neck: No stridor  Cardiovascular: Strong peripheral pulse which is regular and at a normal rate; no evident cyanosis  Respiratory: Normal effort, without audible wheezes or retractions  Musculoskeletal: No deformity; tenderness to palpation right pubic area; no instability; no symptoms on straight leg raise; no tenderness over the lumbar spine  Skin: Warm and dry  Neurological: Alert and conversant  Psychiatric: Normal affect    Pertinent Lab Findings and Radiology Report:  No results found for this visit on 03/27/23.      Assessment and Plan:     ED COURSE, DIFFERENTIAL DIAGNOSIS AND MEDICAL DECISION MAKING:  27  y.o.male presents to the ED with complaint of pelvic pain several months after a fall during which she sustained pubic rami fractures.  Signed over to my colleague pending and repeat CT.  Allergy to Toradol so we are giving Tylenol.    Final Impression:  1. Pelvic pain         Condition: Stable

## 2023-03-27 NOTE — ED Notes (Signed)
Went over discharge instructions with pt, verbalizes understanding.  Denied having any further questions. Pain level is 8 , condition stable. Left ambulatory accompanied by family.  All belongings taken with patient.

## 2023-03-27 NOTE — ED Triage Notes (Signed)
States he broke his pelvis 3 months ago, states now having pain in right groin and right leg and foot.

## 2023-03-31 ENCOUNTER — Other Ambulatory Visit: Payer: Self-pay

## 2023-03-31 ENCOUNTER — Encounter (HOSPITAL_COMMUNITY): Payer: Self-pay

## 2023-03-31 ENCOUNTER — Emergency Department
Admission: EM | Admit: 2023-03-31 | Discharge: 2023-03-31 | Disposition: A | Discharge status: Home / Self Care | Payer: Self-pay | Attending: Family | Admitting: Family

## 2023-03-31 ENCOUNTER — Emergency Department (HOSPITAL_COMMUNITY): Payer: Self-pay

## 2023-03-31 DIAGNOSIS — M25551 Pain in right hip: Secondary | ICD-10-CM | POA: Insufficient documentation

## 2023-03-31 DIAGNOSIS — Z8781 Personal history of (healed) traumatic fracture: Secondary | ICD-10-CM | POA: Insufficient documentation

## 2023-03-31 DIAGNOSIS — M5430 Sciatica, unspecified side: Secondary | ICD-10-CM

## 2023-03-31 DIAGNOSIS — M5431 Sciatica, right side: Secondary | ICD-10-CM | POA: Insufficient documentation

## 2023-03-31 HISTORY — DX: Fracture of neck, unspecified, initial encounter: S12.9XXA

## 2023-03-31 MED ORDER — DEXAMETHASONE SODIUM PHOSPHATE (PF) 10 MG/ML INJECTION SOLUTION
INTRAMUSCULAR | Status: AC
Start: 2023-03-31 — End: 2023-03-31
  Filled 2023-03-31: qty 1

## 2023-03-31 MED ORDER — TRAMADOL 50 MG TABLET
ORAL_TABLET | ORAL | Status: AC
Start: 2023-03-31 — End: 2023-03-31
  Filled 2023-03-31: qty 1

## 2023-03-31 MED ORDER — PREDNISONE 20 MG TABLET
20.0000 mg | ORAL_TABLET | Freq: Three times a day (TID) | ORAL | 0 refills | Status: AC
Start: 2023-03-31 — End: 2023-04-05

## 2023-03-31 MED ORDER — TRAMADOL 50 MG TABLET
50.0000 mg | ORAL_TABLET | ORAL | Status: AC
Start: 2023-03-31 — End: 2023-03-31
  Administered 2023-03-31: 50 mg via ORAL

## 2023-03-31 MED ORDER — METHOCARBAMOL 500 MG TABLET
500.0000 mg | ORAL_TABLET | Freq: Four times a day (QID) | ORAL | 0 refills | Status: AC
Start: 2023-03-31 — End: 2023-04-05

## 2023-03-31 MED ORDER — DEXAMETHASONE SODIUM PHOSPHATE (PF) 10 MG/ML INJECTION SOLUTION
10.0000 mg | INTRAMUSCULAR | Status: AC
Start: 2023-03-31 — End: 2023-03-31
  Administered 2023-03-31: 10 mg via INTRAMUSCULAR

## 2023-03-31 NOTE — ED Provider Notes (Signed)
[facility] - Emergency Department  ED Primary Provider Note  History of Present Illness   Chief Complaint   Patient presents with    Chronic Pain     Christian Huffman is a 27 y.o. male who had concerns including Chronic Pain.  Arrival: The patient arrived by Car    Patient with past medical history of pelvic fracture on the right side in June of 2024 presents to the emergency room for evaluation of pain from the right groin and hip area down into the leg over the last 2-3 weeks.  Patient states he woke up with this pain.  He states pain is currently 8/10.  He notes that he has been rotating on his left hip to avoid putting pressure on that hip as it increases the pain.  He was seen 4 days ago and had a CT of the pelvis done at that time.  Patient states nothing seems to make signs and symptoms better but touch and movement makes the pain worse.  He denies loss of bowel or bladder control, weakness in the lower extremities, pelvic numbness.  He denies pain or numbness in the left lower extremity.  He has no other complaints at this time.  He denies any additional injuries.        History Reviewed This Encounter: Medical History  Surgical History  Family History  Social History    Physical Exam   ED Triage Vitals [03/31/23 1037]   BP (Non-Invasive) 131/85   Heart Rate 66   Respiratory Rate 20   Temperature 36.8 C (98.3 F)   SpO2 99 %   Weight 77.1 kg (170 lb)   Height 1.92 m (6' 3.6")     Physical Exam  Vitals and nursing note reviewed.   Constitutional:       Appearance: Normal appearance.   HENT:      Head: Normocephalic and atraumatic.      Nose: Nose normal.      Mouth/Throat:      Mouth: Mucous membranes are dry.      Pharynx: Oropharynx is clear.   Eyes:      Extraocular Movements: Extraocular movements intact.      Conjunctiva/sclera: Conjunctivae normal.      Pupils: Pupils are equal, round, and reactive to light.   Cardiovascular:      Rate and Rhythm: Normal rate and regular rhythm.       Pulses: Normal pulses.      Heart sounds: Normal heart sounds.   Pulmonary:      Effort: Pulmonary effort is normal.      Breath sounds: Normal breath sounds.   Abdominal:      General: Abdomen is flat. Bowel sounds are normal.      Palpations: Abdomen is soft.      Tenderness: There is no abdominal tenderness.   Musculoskeletal:      Cervical back: Normal range of motion and neck supple. No tenderness.      Lumbar back: Tenderness present. No edema, signs of trauma or bony tenderness. Normal range of motion. Positive right straight leg raise test. Negative left straight leg raise test.        Back:         Legs:       Comments: Tender to palpation over her the hip and gluteal area.   Skin:     General: Skin is warm and dry.      Capillary Refill: Capillary refill takes less  than 2 seconds.   Neurological:      General: No focal deficit present.      Mental Status: He is alert and oriented to person, place, and time.   Psychiatric:         Mood and Affect: Mood normal.         Behavior: Behavior normal.         Thought Content: Thought content normal.         Judgment: Judgment normal.       Patient Data   Labs Ordered/Reviewed - No data to display  CT LUMBAR SPINE WO IV CONTRAST   Final Result by Edi, Radresults In (11/14 1303)   1. NO ACUTE FINDINGS   2. NO SPINAL STENOSIS OR NEURAL FORAMINAL NARROWING         One or more dose reduction techniques were used (e.g., Automated exposure control, adjustment of the mA and/or kV according to patient size, use of iterative reconstruction technique).         Radiologist location ID: [id number]         XR HIP RIGHT W PELVIS 2-3 VIEWS   Final Result by Edi, Radresults In (11/14 1253)   NO ACUTE FRACTURE OR DISLOCATION.       If acute hip fracture is suspected after a fall or minor trauma and initial radiographs are negative then MRI of the pelvis and affected hip without IV contrast or CT of the pelvis and hips without IV contrast is usually appropriate as the next  imaging study. (ACR Appropriateness Criteria: Acute Hip Pain-Suspected Fracture, 2018)                Radiologist location ID: [id number]           Medical Decision Making        Medical Decision Making  Patient to be discharged home due to no significant/urgent findings requiring admission.  Vital signs are stable patient is afebrile.  Patient has no acute findings noted.  Does have presentation assessment consistent with sciatica type pain.  Patient to follow up with his primary care provider outpatient.  He is to return to the ER if he has any loss of bowel or bladder control.  He has no saddle paresthesia, numbness and tingling is consistent with sciatica to the right leg only.  He has no weakness or numbness tingling in the left leg.  Patient to take medication as ordered and follow up outpatient.  He was instructed he may need additional diagnostics outpatient that we do not do here and may need therapy and management of his issues through his primary care.  Patient did verbalize understanding of instructions.  Home care instructions provided with verbalize understanding of instructions    Amount and/or Complexity of Data Reviewed  External Data Reviewed: radiology.     Details: Reviewed CT completed 03/27/2023 at an outlying facility and shows no acute findings  Radiology: ordered. Decision-making details documented in ED Course.    Risk  Prescription drug management.    Critical Care  Total time providing critical care: 0 minutes                Medications Administered in the ED   dexAMETHasone (PF) 10 mg/mL injection (10 mg IntraMUSCULAR Given 03/31/23 1207)   traMADol (ULTRAM) tablet (50 mg Oral Given 03/31/23 1206)     Clinical Impression   Sciatica, unspecified laterality (Primary)   Right hip pain       Disposition:  Discharged    Current Discharge Medication List        START taking these medications    Details   methocarbamoL (ROBAXIN) 500 mg Oral Tablet Take 1 Tablet (500 mg total) by mouth Four  times a day for 5 days  Qty: 20 Tablet, Refills: 0      predniSONE (DELTASONE) 20 mg Oral Tablet Take 1 Tablet (20 mg total) by mouth Three times a day for 5 days  Qty: 15 Tablet, Refills: 0

## 2023-03-31 NOTE — ED Triage Notes (Addendum)
Chronic pain states fx pelvic  months ago .numbness to legs  seen [facility] on 10th

## 2023-03-31 NOTE — ED Nurses Note (Signed)
Patient discharged home with family.  AVS reviewed with patient/care giver.  A written copy of the AVS and discharge instructions was given to the patient/care giver.  Questions sufficiently answered as needed.  Patient/care giver encouraged to follow up with PCP as indicated.  In the event of an emergency, patient/care giver instructed to call 911 or go to the nearest emergency room.  Patient ambulated out of ED independently

## 2023-03-31 NOTE — ED Nurses Note (Signed)
Patient to ED28 with complaints of pelvic pain and pressure in his right groin, hip, and radiates into his back. Patient reports that he fell in June and fractured his pelvis and over the last few days the pain has gotten worse. Patient reports a feeling of numbness and tingling similar to being asleep in his right thigh and calf along with a burning sensation in his groin. Patient reports that the pain in his back and hip is a sharp and stabbing pain. He reports that he has more difficulty moving his right leg in addition to this as well. Reports that the fracture was non surgical and that he was told to take it easy but had went back to work due to financial reason. Denies any change in bowel or bladder but does report appears to be urinating more than usual.

## 2023-03-31 NOTE — Discharge Instructions (Addendum)
FOLLOW UP WITH THE PRIMARY CARE PROVIDER AS SOON AS POSSIBLE BUT NO LATER THAN 3 DAYS NOW.  IF NO PRIMARY CARE PROVIDER EXISTS, THEN THE PATIENT IS INSTRUCTED TO ESTABLISH CARE WITH A PRIMARY CARE PROVIDER. FOLLOW-UP WITH ANY SPECIALIST PROVIDER AS INDICATED AS SOON AS POSSIBLE BUT NO LATER THAN 3 DAYS.  NOTIFY THE PRIMARY CARE PROVIDER THAT YOU WERE IN THE EMERGENCY DEPARTMENT WITHIN 24 HOURS OF DISCHARGE TO FOLLOW-UP ON YOUR RESULTS AND/OR TREATMENTS.  RETURN TO THE EMERGENCY DEPARTMENT IMMEDIATELY IF NEEDED, NO BETTER, WORSE, NEW SYMPTOMS ARISE, OR YOU CANNOT FOLLOW-UP WITH YOUR PRIMARY CARE PROVIDER IN THE PRESCRIBED TIMEFRAME.

## 2023-04-17 DIAGNOSIS — Z419 Encounter for procedure for purposes other than remedying health state, unspecified: Secondary | ICD-10-CM | POA: Diagnosis not present

## 2023-05-18 DIAGNOSIS — Z419 Encounter for procedure for purposes other than remedying health state, unspecified: Secondary | ICD-10-CM | POA: Diagnosis not present

## 2023-05-27 ENCOUNTER — Emergency Department
Admission: EM | Admit: 2023-05-27 | Discharge: 2023-05-27 | Disposition: A | Discharge status: Home / Self Care | Payer: Self-pay | Attending: NURSE PRACTITIONER | Admitting: NURSE PRACTITIONER

## 2023-05-27 ENCOUNTER — Other Ambulatory Visit: Payer: Self-pay

## 2023-05-27 DIAGNOSIS — W000XXA Fall on same level due to ice and snow, initial encounter: Secondary | ICD-10-CM | POA: Insufficient documentation

## 2023-05-27 DIAGNOSIS — Z23 Encounter for immunization: Secondary | ICD-10-CM | POA: Insufficient documentation

## 2023-05-27 DIAGNOSIS — S01411A Laceration without foreign body of right cheek and temporomandibular area, initial encounter: Secondary | ICD-10-CM | POA: Insufficient documentation

## 2023-05-27 DIAGNOSIS — S0181XA Laceration without foreign body of other part of head, initial encounter: Secondary | ICD-10-CM

## 2023-05-27 MED ORDER — DIPHTH,PERTUSSIS(ACEL),TETANUS 2.5 LF UNIT-8 MCG-5 LF/0.5ML IM SYRINGE
0.5000 mL | INJECTION | INTRAMUSCULAR | Status: AC
Start: 2023-05-27 — End: 2023-05-27
  Administered 2023-05-27: 0.5 mL via INTRAMUSCULAR

## 2023-05-27 MED ORDER — DIPHTH,PERTUSSIS(ACEL),TETANUS 2.5 LF UNIT-8 MCG-5 LF/0.5ML IM SYRINGE
INJECTION | INTRAMUSCULAR | Status: AC
Start: 2023-05-27 — End: 2023-05-27
  Filled 2023-05-27: qty 0.5

## 2023-05-27 NOTE — ED Triage Notes (Signed)
Slipped on ice and fell into a car door, Causing laceration under right eye. Bleeding controlled with Band-Aid.

## 2023-05-27 NOTE — ED Provider Notes (Signed)
Emergency Department  [facility]   05/27/2023     Christian Huffman  1995/11/23  28 y.o.  male  [place] [address]   [phone] (home)  PCP: No Pcp   [id number]    Date of service:05/27/2023 22:23    Chief Complaint:   Chief Complaint   Patient presents with    Laceration       Arrival: The patient arrived by Car and is {Accompanied by:[id number]}    HPI: This is a 28 y.o. male who presents to the emergency department ***    ASA***          [place] Pain Rating Scale     On a scale of 0-10, during the past 24 hours, pain has interfered with you usual activity:       On a scale of 0-10, during the past 24 hours, pain has interfered with your sleep:      On a scale of 0-10, during the past 24 hours, pain has affected your mood:       On a scale of 0-10, during the past 24 hours, pain has contributed to your stress:       On a scale of 0-10, what is your overall pain Rating: 1        Past Medical History:   Past Medical History:   Diagnosis Date    ADHD (attention deficit hyperactivity disorder)     Bipolar disorder, unspecified (CMS HCC)     Compression fracture of C-spine (CMS HCC)     C4-C5       Past Surgical History:   Past Surgical History:   Procedure Laterality Date    Gallbladder surgery         Social History:   Social History     Tobacco Use    Smoking status: Former     Current packs/day: 1.50     Average packs/day: 1.5 packs/day for 15.0 years (22.5 ttl pk-yrs)     Types: Cigarettes    Smokeless tobacco: Never   Vaping Use    Vaping status: Former    Substances: Nicotine, THC, Flavoring   Substance Use Topics    Alcohol use: Not Currently    Drug use: Never        Family History:  No family history on file.        Medications Prior to Admission       Prescriptions    diclofenac sodium (VOLTAREN) 75 mg Oral Tablet, Delayed Release (E.C.)    Take 1 Tablet (75 mg total) by mouth Twice daily            Allergies:   Allergies   Allergen Reactions    Haldol [Haloperidol]  Other Adverse Reaction (Add  comment)     Dropped BP and hr    Toradol [Ketorolac]  Other Adverse Reaction (Add comment)     Dropped BP and HR       Above history reviewed with patient.  Allergies, medication list, reviewed.        Physical exam:  There is no height or weight on file to calculate BMI.  ED Triage Vitals [05/27/23 2151]   BP (Non-Invasive) (!) 136/93   Heart Rate 92   Respiratory Rate 19   Temperature 36.6 C (97.8 F)   SpO2 99 %   Weight    Height      Constitutional: patient is oriented to person, place, and time and well-developed, well-nourished, and in no distress.  HENT:   Head: Normocephalic and atraumatic.   Right Ear: External ear normal.   Left Ear: External ear normal.   Nose: Nose normal.   Mouth/Throat: Oropharynx is clear and moist.   Eyes: Pupils are equal, round, and reactive to light. Conjunctivae and EOM are normal.   Neck: Normal range of motion. Neck supple.   Cardiovascular: Normal rate, regular rhythm, normal heart sounds and intact distal pulses.   Pulmonary/Chest: Effort normal and breath sounds normal.   Abdominal: Soft. Bowel sounds are normal.   Musculoskeletal: Normal range of motion.   Neurological:Patient is alert and oriented to person, place, and time.  Gait normal. GCS score is 15.   Skin: Skin is warm and dry.   Psychiatric: Mood, memory, affect and judgment normal.    The following orders were placed after examining the patient :  Orders Placed This Encounter    diphtheria, pertussis-acell, tetanus (BOOSTRIX) IM injection      No orders to display      No results found for this or any previous visit (from the past 12 hour(s)).      ED Course:       Medications Administered in the ED   diphtheria, pertussis-acell, tetanus (BOOSTRIX) IM injection (0.5 mL IntraMUSCULAR Given 05/27/23 2220)        Emergency Department Procedure:  Procedures                         Medical Decision Making       Findings and diagnosis discussed with patient.  Clinical Impression:   Clinical Impression   Facial  laceration, initial encounter (Primary)         Disposition: Discharged          No follow-ups on file.   New Prescriptions    No medications on file      No follow-up provider specified.    Future Appointments  No future appointments.     BP (!) 136/93   Pulse 92   Temp 36.6 C (97.8 F)   Resp 19   SpO2 99%        Bonney Leitz, FNP1/02/2024              This note was partially created using voice recognition software and is inherently subject to errors including those of syntax and "sound alike " substitutions which may escape proof reading.  In such instances, original meaning may be extrapolated by contextual derivation.

## 2023-05-27 NOTE — Discharge Instructions (Addendum)
Please follow-up with the primary care    Please replace the Steri-Strips until the wound is healed, this could a proximally take 1-2 weeks    You can wash her face as regular in the shower however do not fully submerged your face

## 2023-05-28 NOTE — ED Provider Notes (Incomplete)
Emergency Department  [facility]   05/27/2023     Christian Huffman  November 04, 1995  28 y.o.  male  [place] [address]   [phone] (home)  PCP: No Pcp   [id number]    Date of service:05/27/2023 22:23    Chief Complaint:   Chief Complaint   Patient presents with   . Laceration       Arrival: The patient arrived by Car and is accompanied by wife    HPI:  51-year-old male who comes in with a 1 cm laceration right below his right eye, bleeding controlled, patient states that he was trying to clean his car when he slipped on ice fell onto a car door, tetanus is not up-to-date, no other complaints.  Did not significantly hit his had not lost consciousness.    Past Medical History:   Past Medical History:   Diagnosis Date   . ADHD (attention deficit hyperactivity disorder)    . Bipolar disorder, unspecified (CMS HCC)    . Compression fracture of C-spine (CMS HCC)     C4-C5       Past Surgical History:   Past Surgical History:   Procedure Laterality Date   . Gallbladder surgery         Social History:   Social History     Tobacco Use   . Smoking status: Former     Current packs/day: 1.50     Average packs/day: 1.5 packs/day for 15.0 years (22.5 ttl pk-yrs)     Types: Cigarettes   . Smokeless tobacco: Never   Vaping Use   . Vaping status: Former   . Substances: Nicotine, THC, Flavoring   Substance Use Topics   . Alcohol use: Not Currently   . Drug use: Never        Family History:  No family history on file.        Medications Prior to Admission       Prescriptions    diclofenac sodium (VOLTAREN) 75 mg Oral Tablet, Delayed Release (E.C.)    Take 1 Tablet (75 mg total) by mouth Twice daily            Allergies:   Allergies   Allergen Reactions   . Haldol [Haloperidol]  Other Adverse Reaction (Add comment)     Dropped BP and hr   . Toradol [Ketorolac]  Other Adverse Reaction (Add comment)     Dropped BP and HR       Above history reviewed with patient.  Allergies, medication list, reviewed.        Physical  exam:  There is no height or weight on file to calculate BMI.  ED Triage Vitals [05/27/23 2151]   BP (Non-Invasive) (!) 136/93   Heart Rate 92   Respiratory Rate 19   Temperature 36.6 C (97.8 F)   SpO2 99 %   Weight    Height      Constitutional: patient is oriented to person, place, and time and well-developed, well-nourished, and in no distress.   HENT:   Head: Normocephalic and atraumatic.   Right Ear: External ear normal.   Left Ear: External ear normal.   Nose: Nose normal.   Mouth/Throat: Oropharynx is clear and moist.   Eyes: Pupils are equal, round, and reactive to light. Conjunctivae and EOM are normal.   Neck: Normal range of motion. Neck supple.   Cardiovascular: Normal rate, regular rhythm, normal heart sounds and intact distal pulses.   Pulmonary/Chest: Effort normal and  breath sounds normal.   Abdominal: Soft. Bowel sounds are normal.   Musculoskeletal: Normal range of motion.   Neurological:Patient is alert and oriented to person, place, and time.  Gait normal. GCS score is 15.   Skin: Skin is warm and dry. Physical Exam  HENT:      Head: Normocephalic. Laceration present. No raccoon eyes, abrasion or masses.        Comments: 1 cm, 2 mm deep, no bleeding       Psychiatric: Mood, memory, affect and judgment normal.    The following orders were placed after examining the patient :  Orders Placed This Encounter   . diphtheria, pertussis-acell, tetanus (BOOSTRIX) IM injection      No orders to display      No results found for this or any previous visit (from the past 12 hour(s)).      ED Course:       Medications Administered in the ED   diphtheria, pertussis-acell, tetanus (BOOSTRIX) IM injection (0.5 mL IntraMUSCULAR Given 05/27/23 2220)        Emergency Department Procedure:  Lac Repair    Date/Time: 05/28/2023 8:20 PM    Performed by: Bonney Leitz, FNP  Authorized by: Janina Mayo, MD    Consent:     Consent obtained:  Verbal    Consent given by:  Patient    Risks, benefits, and alternatives were  discussed: yes      Risks discussed:  Infection, pain, need for additional repair, poor cosmetic result, tendon damage, nerve damage, poor wound healing, vascular damage and retained foreign body    Alternatives discussed:  No treatment, observation, delayed treatment and referral  Universal protocol:     Procedure explained and questions answered to patient or proxy's satisfaction: yes      Relevant documents present and verified: yes      Test results available: no      Imaging studies available: no      Required blood products, implants, devices, and special equipment available: no      Site/side marked: yes                             Medical Decision Making       Findings and diagnosis discussed with patient.  Clinical Impression:   Clinical Impression   Facial laceration, initial encounter (Primary)         Disposition: Discharged          No follow-ups on file.   New Prescriptions    No medications on file      No follow-up provider specified.    Future Appointments  No future appointments.     BP (!) 136/93   Pulse 92   Temp 36.6 C (97.8 F)   Resp 19   SpO2 99%        Bonney Leitz, FNP1/02/2024              This note was partially created using voice recognition software and is inherently subject to errors including those of syntax and "sound alike " substitutions which may escape proof reading.  In such instances, original meaning may be extrapolated by contextual derivation.

## 2023-06-18 DIAGNOSIS — Z419 Encounter for procedure for purposes other than remedying health state, unspecified: Secondary | ICD-10-CM | POA: Diagnosis not present

## 2023-07-16 DIAGNOSIS — Z419 Encounter for procedure for purposes other than remedying health state, unspecified: Secondary | ICD-10-CM | POA: Diagnosis not present

## 2023-08-27 DIAGNOSIS — Z419 Encounter for procedure for purposes other than remedying health state, unspecified: Secondary | ICD-10-CM | POA: Diagnosis not present

## 2023-09-26 DIAGNOSIS — Z419 Encounter for procedure for purposes other than remedying health state, unspecified: Secondary | ICD-10-CM | POA: Diagnosis not present

## 2023-10-27 DIAGNOSIS — Z419 Encounter for procedure for purposes other than remedying health state, unspecified: Secondary | ICD-10-CM | POA: Diagnosis not present

## 2023-11-26 DIAGNOSIS — Z419 Encounter for procedure for purposes other than remedying health state, unspecified: Secondary | ICD-10-CM | POA: Diagnosis not present

## 2023-12-27 DIAGNOSIS — Z419 Encounter for procedure for purposes other than remedying health state, unspecified: Secondary | ICD-10-CM | POA: Diagnosis not present

## 2024-01-27 DIAGNOSIS — Z419 Encounter for procedure for purposes other than remedying health state, unspecified: Secondary | ICD-10-CM | POA: Diagnosis not present

## 2024-02-24 ENCOUNTER — Other Ambulatory Visit: Payer: Self-pay

## 2024-02-24 ENCOUNTER — Emergency Department
Admission: EM | Admit: 2024-02-24 | Discharge: 2024-02-24 | Disposition: A | Discharge status: Home / Self Care | Payer: Self-pay | Attending: NURSE PRACTITIONER | Admitting: NURSE PRACTITIONER

## 2024-02-24 ENCOUNTER — Encounter (HOSPITAL_COMMUNITY): Payer: Self-pay

## 2024-02-24 DIAGNOSIS — K649 Unspecified hemorrhoids: Secondary | ICD-10-CM

## 2024-02-24 MED ORDER — HYDROCORTISONE ACETATE 25 MG RECTAL SUPPOSITORY
25.0000 mg | Freq: Two times a day (BID) | RECTAL | 0 refills | Status: AC | PRN
Start: 2024-02-24 — End: 2024-03-09

## 2024-02-24 NOTE — ED Provider Notes (Signed)
 Emergency Medicine      Name: Christian Huffman  Age and Gender: 28 y.o. male  Date of Birth: [birth year]  MRN: [id number]  PCP: No Pcp    CC:  Chief Complaint   Patient presents with    Hemorrhoids       HPI:  Christian Huffman is a 28 y.o. White male who presents to the ER with complaints of hemorrhoid. He has some rectal burning and itching.  No significant pain, just some discomfort.  He has had one episode of bright red blood on the toilet paper. No blood mixed in stools.  His girlfriend checked and sees a hemorrhoid.  It is not purple or swelling.  This has been going on for a couple of days.  He has a history of constipation.  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 He admits to sitting on the toilet looking at the phone for 30 minutes at at time.  He has never had hemorrhoids before. He denies unintentional weight loss or loss of appetite.  No abdominal pain. No nausea or vomiting.  No history of IBD.  No fever or chills.     Below pertinent information reviewed with patient:  Past Medical History:   Diagnosis Date    ADHD (attention deficit hyperactivity disorder)     Bipolar disorder, unspecified     Compression fracture of C-spine     C4-C5           Allergies[1]    Past Surgical History:   Procedure Laterality Date    GALLBLADDER SURGERY          Social History     Socioeconomic History    Marital status: Single   Tobacco Use    Smoking status: Every Day     Current packs/day: 1.50     Average packs/day: 1.5 packs/day for 15.0 years (22.5 ttl pk-yrs)     Types: Cigarettes    Smokeless tobacco: Never   Vaping Use    Vaping status: Former    Substances: Nicotine, THC, Flavoring   Substance and Sexual Activity    Alcohol use: Not Currently    Drug use: Never       ROS:  No other overt positive review of systems are noted other than stated in the HPI.      Objective:    ED Triage Vitals [02/24/24 1016]   BP    Heart Rate 71   Respiratory Rate 18   Temperature 36.4 C (97.6 F)   SpO2 98 %   Weight 79.8  kg (176 lb)   Height 1.88 m (6' 2)     Filed Vitals:    02/24/24 1016   Pulse: 71   Resp: 18   Temp: 36.4 C (97.6 F)   SpO2: 98%       Nursing notes and vital signs reviewed.    Constitutional - No acute distress.  Alert and Active.  HEENT - Normocephalic. Atraumatic. PERRL. EOMI. Conjunctiva clear.  Moist mucous membranes.   Neck - Trachea midline. No stridor. No hoarseness.  Cardiac - Regular rate and rhythm. No murmurs, rubs, or gallops.   Respiratory/Chest - Normal respiratory effort. Clear to auscultation bilaterally.   Abdomen - Normal bowel sounds. Non-tender, soft, non-distended. No rebound or guarding.   Rectal: Nurse was chaperone during exam. Non-thrombosed hemorrhoid noted at 0800.  No fissures or other abnormalities noted.   Musculoskeletal - Good AROM.No clubbing, cyanosis or edema.  Skin - Warm  and dry, without any rashes or other lesions.  Neuro - Alert and oriented x 3.No focal weakness. Moving all extremities symmetrically. Normal gait.  Psych - Normal mood and affect. Behavior is normal         Any pertinent labs and imaging obtained during this encounter reviewed below in MDM.    MDM/ED Course:    Patient's history and exam most consistent with hemorrhoid as an etiology for their pain.    Exam not consistent with thrombosed hemorrhoid.  Patient's symptoms and exam not typical for other emergent causes of rectal pain such as, but not limited to, anorectal abscess, rectal foreign body, anal fissure, anal fistula, proctitis, rectal prolapse.    Rx: Bowel regimen, Anusol suppositories BID x 14 days. , sitz bath TID and after each bowel movement Reviewed prevention.     Disposition:   Patient will be discharged with strict return precautions and follow up with primary MD within 1 week for further evaluation or rehcheck.  Return to the ED for any change or worsening of symptoms.   Patient voiced understanding.  And agrees with plan of care.      Work note provided.      Medical Decision  Making  Risk  Prescription drug management.        Workup:     No results found for this or any previous visit (from the past 24 hours).         Orders Placed This Encounter    hydrocortisone acetate (ANUSOL-HC) 25 mg Rectal Suppository           Abnormal Lab results:  Labs Ordered/Reviewed - No data to display        Orders Placed This Encounter    hydrocortisone acetate (ANUSOL-HC) 25 mg Rectal Suppository         Any procedures:  Procedures    Impression:   Clinical Impression   Hemorrhoids, unspecified hemorrhoid type (Primary)       Disposition: Discharged    / Eleanor Mackie FNP-C  02/24/2024, 10:42  [facility], [place] Departments of Emergency Medicine  [place]    Portions of this note may have been dictated using voice recognition software.     -----------------------  No results found for this or any previous visit (from the past 12 hours).  No orders to display            [1]   Allergies  Allergen Reactions    Haldol [Haloperidol]  Other Adverse Reaction (Add comment)     Dropped BP and hr    Toradol  [Ketorolac ]  Other Adverse Reaction (Add comment)     Dropped BP and HR

## 2024-02-24 NOTE — ED Nurses Note (Signed)

## 2024-03-12 ENCOUNTER — Encounter (HOSPITAL_COMMUNITY): Payer: Self-pay

## 2024-04-01 ENCOUNTER — Emergency Department
Admission: EM | Admit: 2024-04-01 | Discharge: 2024-04-01 | Disposition: A | Discharge status: Home / Self Care | Payer: Self-pay | Attending: Emergency Medicine | Admitting: Emergency Medicine

## 2024-04-01 ENCOUNTER — Encounter (HOSPITAL_BASED_OUTPATIENT_CLINIC_OR_DEPARTMENT_OTHER): Payer: Self-pay

## 2024-04-01 ENCOUNTER — Other Ambulatory Visit: Payer: Self-pay

## 2024-04-01 ENCOUNTER — Emergency Department (HOSPITAL_BASED_OUTPATIENT_CLINIC_OR_DEPARTMENT_OTHER): Payer: Self-pay

## 2024-04-01 DIAGNOSIS — R0781 Pleurodynia: Secondary | ICD-10-CM

## 2024-04-01 DIAGNOSIS — S20211A Contusion of right front wall of thorax, initial encounter: Secondary | ICD-10-CM | POA: Insufficient documentation

## 2024-04-01 DIAGNOSIS — W19XXXA Unspecified fall, initial encounter: Secondary | ICD-10-CM

## 2024-04-01 DIAGNOSIS — W1781XA Fall down embankment (hill), initial encounter: Secondary | ICD-10-CM | POA: Insufficient documentation

## 2024-04-01 DIAGNOSIS — R591 Generalized enlarged lymph nodes: Secondary | ICD-10-CM | POA: Insufficient documentation

## 2024-04-01 HISTORY — DX: Unspecified hemorrhoids: K64.9

## 2024-04-01 HISTORY — DX: Fracture of unspecified parts of lumbosacral spine and pelvis, initial encounter for closed fracture: S32.9XXA

## 2024-04-01 MED ORDER — IBUPROFEN 800 MG TABLET
800.0000 mg | ORAL_TABLET | ORAL | Status: AC
Start: 2024-04-01 — End: 2024-04-01
  Administered 2024-04-01: 800 mg via ORAL

## 2024-04-01 MED ORDER — IBUPROFEN 800 MG TABLET
800.0000 mg | ORAL_TABLET | Freq: Four times a day (QID) | ORAL | 0 refills | Status: AC | PRN
Start: 2024-04-01 — End: ?

## 2024-04-01 MED ORDER — IBUPROFEN 800 MG TABLET
ORAL_TABLET | ORAL | Status: AC
Start: 2024-04-01 — End: 2024-04-01
  Filled 2024-04-01: qty 1

## 2024-04-01 MED ORDER — CYCLOBENZAPRINE 10 MG TABLET
ORAL_TABLET | ORAL | Status: AC
Start: 2024-04-01 — End: 2024-04-01
  Filled 2024-04-01: qty 1

## 2024-04-01 MED ORDER — CYCLOBENZAPRINE 10 MG TABLET
10.0000 mg | ORAL_TABLET | ORAL | Status: AC
Start: 2024-04-01 — End: 2024-04-01
  Administered 2024-04-01: 10 mg via ORAL

## 2024-04-01 MED ORDER — CYCLOBENZAPRINE 10 MG TABLET: 10.0000 mg | ORAL_TABLET | Freq: Three times a day (TID) | ORAL | 0 refills | Status: DC | PRN

## 2024-05-22 ENCOUNTER — Emergency Department
Admission: EM | Admit: 2024-05-22 | Discharge: 2024-05-22 | Disposition: A | Discharge status: Home / Self Care | Payer: Self-pay

## 2024-05-22 ENCOUNTER — Encounter (HOSPITAL_BASED_OUTPATIENT_CLINIC_OR_DEPARTMENT_OTHER): Payer: Self-pay

## 2024-05-22 ENCOUNTER — Other Ambulatory Visit: Payer: Self-pay

## 2024-05-22 DIAGNOSIS — R10819 Abdominal tenderness, unspecified site: Secondary | ICD-10-CM | POA: Insufficient documentation

## 2024-05-22 DIAGNOSIS — A09 Infectious gastroenteritis and colitis, unspecified: Secondary | ICD-10-CM | POA: Insufficient documentation

## 2024-05-22 DIAGNOSIS — M791 Myalgia, unspecified site: Secondary | ICD-10-CM | POA: Insufficient documentation

## 2024-05-22 DIAGNOSIS — B349 Viral infection, unspecified: Secondary | ICD-10-CM

## 2024-05-22 DIAGNOSIS — R519 Headache, unspecified: Secondary | ICD-10-CM | POA: Insufficient documentation

## 2024-05-22 DIAGNOSIS — Z888 Allergy status to other drugs, medicaments and biological substances status: Secondary | ICD-10-CM | POA: Insufficient documentation

## 2024-05-22 LAB — COVID-19, FLU A/B, RSV RAPID BY PCR
INFLUENZA VIRUS TYPE A: NOT DETECTED
INFLUENZA VIRUS TYPE B: NOT DETECTED
RESPIRATORY SYNCYTIAL VIRUS (RSV): NOT DETECTED
SARS-CoV-2: NOT DETECTED

## 2024-05-22 MED ORDER — ACETAMINOPHEN 325 MG TABLET
ORAL_TABLET | ORAL | Status: AC
  Filled 2024-05-22: qty 3

## 2024-05-22 MED ORDER — ACETAMINOPHEN 325 MG TABLET
975.0000 mg | ORAL_TABLET | ORAL | Status: AC
  Administered 2024-05-22: 975 mg via ORAL

## 2024-05-22 NOTE — ED Nurses Note (Signed)

## 2024-05-22 NOTE — Discharge Instructions (Signed)
Thank you for allowing [place] to be part of your care.    Please discuss all medications with your pharmacist to ensure there are no concerns of interactions.    Please ensure all questions or concerns are addressed prior to leaving the hospital. We want to make sure your concerns are addressed to make sure you are as safe and healthy as possible. By leaving the hospital, it is understood you are in agreement with your treatment plan.      Please call the hospital medical records office for a copy of your finalized results, and review them with a primary care physician, for any findings needing further attention.    If you feel your situation worsens, or does not get better in 48 hours, please see a physician for evaluation.    We encourage you to see your regular doctor as soon as possible to let them know you were seen in the emergency department. They may want to do further testing. If you do not have a doctor, please feel free to call the hospital, and ask for contact information of accepting providers. Please also discuss your vaccinations, and ensure all are up to date.    You may use this document to take today off work or school.

## 2024-05-22 NOTE — ED Provider Notes (Signed)
 [facility], [facility] - Emergency Department  ED Primary Note  History of Present Illness  Christian Huffman is a 29 year old male who presents with diarrhea and headache.    Diarrhea  - Acute onset at approximately 4:00 AM today  - No associated fever; temperature measured at 37F this morning    Headache  - Onset concurrent with diarrhea this morning  - Improved with Tylenol  flu formulation  - No other medications taken for headache today    Medication allergies and adverse reactions  - Allergic to Haldol and Tylenol   - Previous adverse reaction to one of these medications affecting blood pressure; unable to recall which    Tobacco use  - Smokes almost one pack of cigarettes per day  Physical Exam   ED Triage Vitals [05/22/24 1308]   BP (Non-Invasive) (!) 141/67   Heart Rate 74   Respiratory Rate 19   Temperature 36.5 C (97.7 F)   SpO2 98 %   Weight 76.7 kg (169 lb 2 oz)   Height 1.854 m (6' 1)     Physical Exam  GENERAL: Alert, cooperative, well developed, no acute distress  HEENT: Normocephalic, normal oropharynx, moist mucous membranes  CHEST: Clear to auscultation bilaterally, no wheezes, rhonchi, or crackles  CARDIOVASCULAR: Normal heart rate and rhythm, S1 and S2 normal without murmurs  ABDOMEN: Soft, tender on palpation, non-distended, without organomegaly, normal bowel sounds  EXTREMITIES: No cyanosis or edema  NEUROLOGICAL: Cranial nerves grossly intact, moves all extremities without gross motor or sensory deficit  Patient Data   Results        Labs Ordered/Reviewed   COVID-19, FLU A/B, RSV RAPID BY PCR - Normal    Narrative:     Results are for the simultaneous qualitative identification of SARS-CoV-2 (formerly 2019-nCoV), Influenza A, Influenza B, and RSV RNA. These etiologic agents are generally detectable in nasopharyngeal and nasal swabs during the ACUTE PHASE of infection. Hence, this test is intended to be performed on respiratory specimens collected from individuals with  signs and symptoms of upper respiratory tract infection who meet Centers for Disease Control and Prevention (CDC) clinical and/or epidemiological criteria for Coronavirus Disease 2019 (COVID-19) testing. CDC COVID-19 criteria for testing on human specimens is available at [facility] webpage information for Healthcare Professionals: Coronavirus Disease 2019 (COVID-19) ([employer].com.au).     False-negative results may occur if the virus has genomic mutations, insertions, deletions, or rearrangements or if performed very early in the course of illness. Otherwise, negative results indicate virus specific RNA targets are not detected, however negative results do not preclude SARS-CoV-2 infection/COVID-19, Influenza, or Respiratory syncytial virus infection. Results should not be used as the sole basis for patient management decisions. Negative results must be combined with clinical observations, patient history, and epidemiological information. If upper respiratory tract infection is still suspected based on exposure history together with other clinical findings, re-testing should be considered.    Test methodology:   Cepheid Xpert Xpress SARS-CoV-2/Flu/RSV Assay real-time polymerase chain reaction (RT-PCR) test on the GeneXpert Dx and Xpert Xpress systems.     No orders to display     Medical Decision Making          Medical Decision Making  A 29 year old male with a history of prior hemoglobin removal and allergies to Haldol and Tylenol  presented with acute onset of diarrhea, headache, and mild abdominal tenderness since early morning. He denied fever, and his temperature was reportedly normal at home. He smokes nearly a pack of cigarettes  daily and has no other significant medical or surgical history. Examination revealed mild abdominal tenderness without other concerning findings.  Patient's swabs found to be negative.  Patient likely has an acute viral syndrome.  Patient  encouraged to use over-the-counter cough cold medication as well as Tylenol  and Motrin  for discomfort.    Acute infectious gastroenteritis  - Administered medication for headache and myalgia  - Order viral swab test  - Provided work absence documentation  Medical Decision Making  Amount and/or Complexity of Data Reviewed  Labs: ordered. Decision-making details documented in ED Course.  ECG/medicine tests: independent interpretation performed. Decision-making details documented in ED Course.    Risk  OTC drugs.      ED Course as of 05/22/24 1503   Tue May 22, 2024   1421 COVID-19, FLU A/B, RSV RAPID BY PCR  Not detected        Medications Ordered/Administered in the ED   acetaminophen  (TYLENOL ) tablet (975 mg Oral Given 05/22/24 1334)     Clinical Impression   Acute viral syndrome (Primary)     Disposition: Discharged         This note was created with assistance from [employer] via capture of conversational audio.  Consent was obtained from the patient prior to recording.
